# Patient Record
Sex: Female | Born: 1940 | Race: White | Hispanic: No | Smoking: Never smoker
Health system: Southern US, Community
[De-identification: ages and names within clinical notes are randomized; demographics above are authoritative.]

## PROBLEM LIST (undated history)

## (undated) DIAGNOSIS — R053 Chronic cough: Secondary | ICD-10-CM

## (undated) DIAGNOSIS — I10 Essential (primary) hypertension: Secondary | ICD-10-CM

## (undated) DIAGNOSIS — M1990 Unspecified osteoarthritis, unspecified site: Secondary | ICD-10-CM

## (undated) DIAGNOSIS — K219 Gastro-esophageal reflux disease without esophagitis: Secondary | ICD-10-CM

## (undated) DIAGNOSIS — E669 Obesity, unspecified: Secondary | ICD-10-CM

## (undated) DIAGNOSIS — Z9889 Other specified postprocedural states: Secondary | ICD-10-CM

## (undated) HISTORY — PX: APPENDECTOMY: SHX54

---

## 2015-11-25 ENCOUNTER — Ambulatory Visit
Admission: EM | Admit: 2015-11-25 | Discharge: 2015-11-25 | Disposition: A | Discharge status: Home / Self Care | Payer: PPO | Attending: Family Medicine | Admitting: Family Medicine

## 2015-11-25 ENCOUNTER — Ambulatory Visit (INDEPENDENT_AMBULATORY_CARE_PROVIDER_SITE_OTHER): Payer: PPO

## 2015-11-25 DIAGNOSIS — S40011A Contusion of right shoulder, initial encounter: Secondary | ICD-10-CM

## 2015-11-25 DIAGNOSIS — M7581 Other shoulder lesions, right shoulder: Secondary | ICD-10-CM | POA: Diagnosis not present

## 2015-11-25 DIAGNOSIS — S40021A Contusion of right upper arm, initial encounter: Secondary | ICD-10-CM

## 2015-11-25 DIAGNOSIS — M79601 Pain in right arm: Secondary | ICD-10-CM | POA: Diagnosis not present

## 2015-11-25 DIAGNOSIS — M25511 Pain in right shoulder: Secondary | ICD-10-CM | POA: Diagnosis not present

## 2015-11-25 DIAGNOSIS — IMO0001 Reserved for inherently not codable concepts without codable children: Secondary | ICD-10-CM

## 2015-11-25 NOTE — Discharge Instructions (Signed)

## 2015-11-25 NOTE — ED Provider Notes (Signed)
CSN: [id number]     Arrival date & time 11/25/15  0804 History   First MD Initiated Contact with Patient 11/25/15 [phone]     Chief Complaint  Patient presents with  . Shoulder Pain   (Consider location/radiation/quality/duration/timing/severity/associated sxs/prior Treatment) HPI   75 year old female who presents in injury to her right dominant shoulder occurred yesterday. He states that she was at her daughter's home when she tripped over a board tended to the head dated the dog from going outside. She states that she fell directly onto her right shoulder aching that her arm was slightly abducted. At that time the pain has been severe mostly with movement and she indicates subacromial radiation into her upper humeral area. She was able to sleep fairly comfortably last night by sleeping in a recliner. She denies any numbness or tingling distally.  History reviewed. No pertinent past medical history. Past Surgical History  Procedure Laterality Date  . Appendectomy     Family History  Problem Relation Age of Onset  . Heart attack Mother   . Heart failure Father    Social History  Substance Use Topics  . Smoking status: Never Smoker   . Smokeless tobacco: None  . Alcohol Use: No   OB History    No data available     Review of Systems  Constitutional: Positive for activity change. Negative for fever and chills.  Musculoskeletal: Positive for arthralgias.  All other systems reviewed and are negative.   Allergies  Shellfish allergy  Home Medications   Prior to Admission medications   Not on File   Meds Ordered and Administered this Visit  Medications - No data to display  BP 138/77 mmHg  Pulse 74  Temp(Src) 97.3 F (36.3 C) (Tympanic)  Resp 17  Ht  (1.626 m)  Wt 235 lb (106.595 kg)  BMI 40.32 kg/m2  SpO2 98% No data found.   Physical Exam  Constitutional: She is oriented to person, place, and time. She appears well-developed and well-nourished. No distress.   HENT:  Head: Normocephalic and atraumatic.  Eyes: Conjunctivae are normal. Pupils are equal, round, and reactive to light.  Neck: Normal range of motion. Neck supple.  Musculoskeletal: She exhibits tenderness.  Examination of the right shoulder shows fairly comfortable range of motion through a limited range with relaxation and done passively. Actively the patient does have very slight limited range of motion. Is mostly with external rotation and abduction. Distal sensation is intact to light touch throughout. She has a full range of motion and is comfortable with the elbow wrist and fingers. She has no clavicular tenderness. There is no tenderness about the acromion acromion with subacromial there is tenderness of the proximal humerus as well. Her neck is comfortable without any limitation of motion.  Neurological: She is alert and oriented to person, place, and time.  Skin: Skin is warm and dry. She is not diaphoretic.  Psychiatric: She has a normal mood and affect. Her behavior is normal. Judgment and thought content normal.  Nursing note and vitals reviewed.   ED Course  Procedures (including critical care time)  Labs Review Labs Reviewed - No data to display  Imaging Review Dg Shoulder Right  11/25/2015  CLINICAL DATA:  75 year old female with right proximal anterior humerus pain and very decreased range of motion after a fall yesterday. Initial encounter. EXAM: RIGHT SHOULDER - 2+ VIEW COMPARISON:  None. FINDINGS: No glenohumeral joint dislocation. No fracture identified in the proximal right humerus. There are  small ossific fragments along the inferior rim of the glenoid, but those appear to be degenerative and/or chronic. The right scapula otherwise appears intact. Right clavicle intact. Visible right ribs and lung parenchyma within normal limits. IMPRESSION: No acute fracture or dislocation identified about the right shoulder. If occult fracture is suspected right shoulder CT may be  most valuable. Electronically Signed   By: Odessa Fleming M.D.   On: 11/25/2015 08:59     Visual Acuity Review  Right Eye Distance:   Left Eye Distance:   Bilateral Distance:    Right Eye Near:   Left Eye Near:    Bilateral Near:         MDM   1. Contusion of right shoulder or upper extremity, initial encounter   2. Rotator cuff tendonitis, right    New Prescriptions   No medications on file  Plan: 1. Test/x-ray results and diagnosis reviewed with patient 2. rx as per orders; risks, benefits, potential side effects reviewed with patient 3. Recommend supportive treatment with a sling for comfort. Recommended she continue to sleep in a recliner for comfort as well. Instructed her in pendulum exercises try maintain shoulder range of motion. She will use anti-inflammatory medications for pain relief. She refused narcotic medications. I've also recommended that she make an appointment with orthopedics to follow her progress. No obvious fractures or dislocations on today's films. Did tell her that there is a possibility of an occult fracture but would most likely be treated the same. However because of her distinct risk of a frozen shoulder orthopedic follow-up would be and encouraged. 4. F/u with orthopedic surgeon   Lutricia Feil, PA-C 11/25/15 0945

## 2015-11-25 NOTE — ED Notes (Signed)
States tripped over a board yesterday morning landing on right shoulder. Unable to lift arm or move shoulder.

## 2015-12-07 DIAGNOSIS — M25511 Pain in right shoulder: Secondary | ICD-10-CM | POA: Diagnosis not present

## 2015-12-15 ENCOUNTER — Other Ambulatory Visit: Payer: Self-pay | Admitting: Orthopedic Surgery

## 2015-12-15 DIAGNOSIS — M25511 Pain in right shoulder: Secondary | ICD-10-CM

## 2016-01-05 ENCOUNTER — Ambulatory Visit
Admission: RE | Admit: 2016-01-05 | Discharge: 2016-01-05 | Disposition: A | Discharge status: Home / Self Care | Payer: PPO | Source: Ambulatory Visit | Attending: Orthopedic Surgery | Admitting: Orthopedic Surgery

## 2016-01-05 DIAGNOSIS — S46011A Strain of muscle(s) and tendon(s) of the rotator cuff of right shoulder, initial encounter: Secondary | ICD-10-CM | POA: Diagnosis not present

## 2016-01-05 DIAGNOSIS — M19011 Primary osteoarthritis, right shoulder: Secondary | ICD-10-CM | POA: Diagnosis not present

## 2016-01-05 DIAGNOSIS — X58XXXA Exposure to other specified factors, initial encounter: Secondary | ICD-10-CM | POA: Diagnosis not present

## 2016-01-05 DIAGNOSIS — M25411 Effusion, right shoulder: Secondary | ICD-10-CM | POA: Diagnosis not present

## 2016-01-05 DIAGNOSIS — M25511 Pain in right shoulder: Secondary | ICD-10-CM | POA: Diagnosis not present

## 2016-01-10 DIAGNOSIS — M75121 Complete rotator cuff tear or rupture of right shoulder, not specified as traumatic: Secondary | ICD-10-CM | POA: Diagnosis not present

## 2016-01-21 ENCOUNTER — Other Ambulatory Visit: Payer: Self-pay | Admitting: Orthopedic Surgery

## 2016-02-18 DIAGNOSIS — Z136 Encounter for screening for cardiovascular disorders: Secondary | ICD-10-CM | POA: Diagnosis not present

## 2016-02-18 DIAGNOSIS — Z01818 Encounter for other preprocedural examination: Secondary | ICD-10-CM | POA: Diagnosis not present

## 2016-02-18 DIAGNOSIS — Z1389 Encounter for screening for other disorder: Secondary | ICD-10-CM | POA: Diagnosis not present

## 2016-02-18 DIAGNOSIS — Z2821 Immunization not carried out because of patient refusal: Secondary | ICD-10-CM | POA: Diagnosis not present

## 2016-02-18 DIAGNOSIS — Z6841 Body Mass Index (BMI) 40.0 and over, adult: Secondary | ICD-10-CM | POA: Diagnosis not present

## 2016-03-06 ENCOUNTER — Encounter: Payer: Self-pay | Admitting: *Deleted

## 2016-03-06 ENCOUNTER — Other Ambulatory Visit: Payer: PPO

## 2016-03-06 NOTE — Patient Instructions (Signed)
  Your procedure is scheduled on: 03-09-16 (THURSDAY) Report to [facility] 2ND FLOOR To find out your arrival time please call [phone] between 1PM - 3PM on 03-08-16 [facility](WEDNESDAY)  Remember: Instructions that are not followed completely may result in serious medical risk, up to and including death, or upon the discretion of your surgeon and anesthesiologist your surgery may need to be rescheduled.    _X___ 1. Do not eat food or drink liquids after midnight. No gum chewing or hard candies.     _X___ 2. No Alcohol for 24 hours before or after surgery.   ____ 3. Bring all medications with you on the day of surgery if instructed.    _X___ 4. Notify your doctor if there is any change in your medical condition     (cold, fever, infections).     Do not wear jewelry, make-up, hairpins, clips or nail polish.  Do not wear lotions, powders, or perfumes. You may wear deodorant.  Do not shave 48 hours prior to surgery. Men may shave face and neck.  Do not bring valuables to the hospital.    [facility] is not responsible for any belongings or valuables.               Contacts, dentures or bridgework may not be worn into surgery.  Leave your suitcase in the car. After surgery it may be brought to your room.  For patients admitted to the hospital, discharge time is determined by your treatment team.   Patients discharged the day of surgery will not be allowed to drive home.   Please read over the following fact sheets that you were given:      ____ Take these medicines the morning of surgery with A SIP OF WATER:    1. NONE  2.   3.   4.  5.  6.  ____ Fleet Enema (as directed)   _X___ Use CHG Soap as directed  ____ Use inhalers on the day of surgery  ____ Stop metformin 2 days prior to surgery    ____ Take 1/2 of usual insulin dose the night before surgery and none on the morning of surgery.   ____ Stop Coumadin/Plavix/aspirin-N/A  _X___ Stop  Anti-inflammatories-STOP IBUPROFEN NOW-NO NSAIDS OR ASPIRIN PRODUCTS-TYLENOL OK TO TAKE   ____ Stop supplements until after surgery.    ____ Bring C-Pap to the hospital.

## 2016-03-07 ENCOUNTER — Encounter
Admission: RE | Admit: 2016-03-07 | Discharge: 2016-03-07 | Disposition: A | Discharge status: Home / Self Care | Payer: PPO | Source: Ambulatory Visit | Attending: Orthopedic Surgery | Admitting: Orthopedic Surgery

## 2016-03-07 DIAGNOSIS — M7541 Impingement syndrome of right shoulder: Secondary | ICD-10-CM | POA: Diagnosis not present

## 2016-03-07 DIAGNOSIS — M75101 Unspecified rotator cuff tear or rupture of right shoulder, not specified as traumatic: Secondary | ICD-10-CM | POA: Diagnosis not present

## 2016-03-07 DIAGNOSIS — Z6841 Body Mass Index (BMI) 40.0 and over, adult: Secondary | ICD-10-CM | POA: Diagnosis not present

## 2016-03-07 DIAGNOSIS — M19011 Primary osteoarthritis, right shoulder: Secondary | ICD-10-CM | POA: Diagnosis not present

## 2016-03-07 DIAGNOSIS — Z91013 Allergy to seafood: Secondary | ICD-10-CM | POA: Diagnosis not present

## 2016-03-07 LAB — PROTIME-INR
INR: 1.01
Prothrombin Time: 13.5 seconds (ref 11.4–15.0)

## 2016-03-07 LAB — APTT: APTT: 29 s (ref 24–36)

## 2016-03-09 ENCOUNTER — Observation Stay
Admission: RE | Admit: 2016-03-09 | Discharge: 2016-03-10 | Disposition: A | Discharge status: Home / Self Care | Payer: PPO | Source: Ambulatory Visit | Attending: Orthopedic Surgery | Admitting: Orthopedic Surgery

## 2016-03-09 ENCOUNTER — Ambulatory Visit: Payer: PPO | Admitting: Anesthesiology

## 2016-03-09 ENCOUNTER — Encounter
Admission: RE | Disposition: A | Discharge status: Home / Self Care | Payer: Self-pay | Source: Ambulatory Visit | Attending: Orthopedic Surgery

## 2016-03-09 DIAGNOSIS — M7541 Impingement syndrome of right shoulder: Secondary | ICD-10-CM | POA: Diagnosis not present

## 2016-03-09 DIAGNOSIS — Z5333 Arthroscopic surgical procedure converted to open procedure: Secondary | ICD-10-CM | POA: Diagnosis not present

## 2016-03-09 DIAGNOSIS — M75120 Complete rotator cuff tear or rupture of unspecified shoulder, not specified as traumatic: Secondary | ICD-10-CM | POA: Diagnosis present

## 2016-03-09 DIAGNOSIS — M19011 Primary osteoarthritis, right shoulder: Secondary | ICD-10-CM | POA: Insufficient documentation

## 2016-03-09 DIAGNOSIS — G8918 Other acute postprocedural pain: Secondary | ICD-10-CM | POA: Diagnosis not present

## 2016-03-09 DIAGNOSIS — Z6841 Body Mass Index (BMI) 40.0 and over, adult: Secondary | ICD-10-CM | POA: Insufficient documentation

## 2016-03-09 DIAGNOSIS — M75101 Unspecified rotator cuff tear or rupture of right shoulder, not specified as traumatic: Secondary | ICD-10-CM | POA: Diagnosis not present

## 2016-03-09 DIAGNOSIS — Z91013 Allergy to seafood: Secondary | ICD-10-CM | POA: Insufficient documentation

## 2016-03-09 DIAGNOSIS — M75121 Complete rotator cuff tear or rupture of right shoulder, not specified as traumatic: Secondary | ICD-10-CM | POA: Diagnosis not present

## 2016-03-09 DIAGNOSIS — S43421A Sprain of right rotator cuff capsule, initial encounter: Secondary | ICD-10-CM | POA: Diagnosis not present

## 2016-03-09 DIAGNOSIS — M25511 Pain in right shoulder: Secondary | ICD-10-CM | POA: Diagnosis not present

## 2016-03-09 HISTORY — PX: SHOULDER ARTHROSCOPY WITH OPEN ROTATOR CUFF REPAIR AND DISTAL CLAVICLE ACROMINECTOMY: SHX5683

## 2016-03-09 SURGERY — SHOULDER ARTHROSCOPY WITH OPEN ROTATOR CUFF REPAIR AND DISTAL CLAVICLE ACROMINECTOMY
Anesthesia: General | Laterality: Right | Wound class: Clean

## 2016-03-09 MED ORDER — IBUPROFEN 400 MG PO TABS
200.0000 mg | ORAL_TABLET | ORAL | Status: DC | PRN
  Administered 2016-03-09 – 2016-03-10 (×2): 200 mg via ORAL
  Filled 2016-03-09 (×2): qty 1

## 2016-03-09 MED ORDER — FAMOTIDINE 20 MG PO TABS: 20.0000 mg | ORAL_TABLET | Freq: Once | ORAL | Status: DC

## 2016-03-09 MED ORDER — BUPIVACAINE HCL (PF) 0.25 % IJ SOLN
INTRAMUSCULAR | Status: AC
  Filled 2016-03-09: qty 30

## 2016-03-09 MED ORDER — EPINEPHRINE HCL 1 MG/ML IJ SOLN
INTRAMUSCULAR | Status: AC
  Filled 2016-03-09: qty 1

## 2016-03-09 MED ORDER — ROPIVACAINE HCL 5 MG/ML IJ SOLN
INTRAMUSCULAR | Status: DC | PRN
  Administered 2016-03-09 (×3): 10 mL via PERINEURAL

## 2016-03-09 MED ORDER — CEFAZOLIN SODIUM-DEXTROSE 2-4 GM/100ML-% IV SOLN
2.0000 g | INTRAVENOUS | Status: AC
  Administered 2016-03-09: 2 g via INTRAVENOUS

## 2016-03-09 MED ORDER — ONDANSETRON HCL 4 MG/2ML IJ SOLN
INTRAMUSCULAR | Status: DC | PRN
  Administered 2016-03-09: 4 mg via INTRAVENOUS

## 2016-03-09 MED ORDER — OXYCODONE HCL 5 MG PO TABS: 5.0000 mg | ORAL_TABLET | ORAL | Status: DC | PRN

## 2016-03-09 MED ORDER — NEOMYCIN-POLYMYXIN B GU 40-200000 IR SOLN
Status: AC
  Filled 2016-03-09: qty 20

## 2016-03-09 MED ORDER — NEOSTIGMINE METHYLSULFATE 10 MG/10ML IV SOLN
INTRAVENOUS | Status: DC | PRN
  Administered 2016-03-09: 4 mg via INTRAVENOUS

## 2016-03-09 MED ORDER — ONDANSETRON HCL 4 MG/2ML IJ SOLN
INTRAMUSCULAR | Status: AC
  Filled 2016-03-09: qty 2

## 2016-03-09 MED ORDER — LIDOCAINE HCL (CARDIAC) 20 MG/ML IV SOLN
INTRAVENOUS | Status: DC | PRN
  Administered 2016-03-09: 10 mg via INTRAVENOUS
  Administered 2016-03-09: 40 mg via INTRAVENOUS

## 2016-03-09 MED ORDER — PROMETHAZINE HCL 12.5 MG RE SUPP
12.5000 mg | Freq: Once | RECTAL | Status: AC
  Administered 2016-03-09: 12.5 mg via RECTAL
  Filled 2016-03-09: qty 1

## 2016-03-09 MED ORDER — FENTANYL CITRATE (PF) 100 MCG/2ML IJ SOLN: 25.0000 ug | INTRAMUSCULAR | Status: DC | PRN

## 2016-03-09 MED ORDER — IPRATROPIUM-ALBUTEROL 0.5-2.5 (3) MG/3ML IN SOLN
RESPIRATORY_TRACT | Status: AC
  Administered 2016-03-09: 3 mL
  Filled 2016-03-09: qty 3

## 2016-03-09 MED ORDER — OXYCODONE HCL 5 MG/5ML PO SOLN: 5.0000 mg | Freq: Once | ORAL | Status: DC | PRN

## 2016-03-09 MED ORDER — ONDANSETRON HCL 4 MG/2ML IJ SOLN
4.0000 mg | Freq: Four times a day (QID) | INTRAMUSCULAR | Status: DC
  Administered 2016-03-09 – 2016-03-10 (×2): 4 mg via INTRAVENOUS
  Filled 2016-03-09 (×3): qty 2

## 2016-03-09 MED ORDER — OXYCODONE HCL 5 MG PO TABS: 5.0000 mg | ORAL_TABLET | Freq: Once | ORAL | Status: DC | PRN

## 2016-03-09 MED ORDER — ACETAMINOPHEN 500 MG PO TABS
500.0000 mg | ORAL_TABLET | ORAL | Status: DC | PRN
  Administered 2016-03-10: 500 mg via ORAL
  Filled 2016-03-09: qty 1

## 2016-03-09 MED ORDER — GLYCOPYRROLATE 0.2 MG/ML IJ SOLN
INTRAMUSCULAR | Status: DC | PRN
  Administered 2016-03-09: 0.6 mg via INTRAVENOUS

## 2016-03-09 MED ORDER — FAMOTIDINE 20 MG PO TABS
ORAL_TABLET | ORAL | Status: AC
  Administered 2016-03-09: 20 mg
  Filled 2016-03-09: qty 1

## 2016-03-09 MED ORDER — OXYCODONE HCL 5 MG PO TABS
5.0000 mg | ORAL_TABLET | ORAL | Status: DC | PRN
  Administered 2016-03-09 – 2016-03-10 (×5): 5 mg via ORAL
  Filled 2016-03-09 (×5): qty 1

## 2016-03-09 MED ORDER — LIDOCAINE HCL (PF) 1 % IJ SOLN
INTRAMUSCULAR | Status: AC
  Filled 2016-03-09: qty 30

## 2016-03-09 MED ORDER — CEFAZOLIN SODIUM-DEXTROSE 2-4 GM/100ML-% IV SOLN
INTRAVENOUS | Status: AC
  Filled 2016-03-09: qty 100

## 2016-03-09 MED ORDER — PHENYLEPHRINE HCL 10 MG/ML IJ SOLN
INTRAMUSCULAR | Status: DC | PRN
  Administered 2016-03-09 (×2): 100 ug via INTRAVENOUS
  Administered 2016-03-09: 200 ug via INTRAVENOUS
  Administered 2016-03-09 (×4): 100 ug via INTRAVENOUS

## 2016-03-09 MED ORDER — CHLORHEXIDINE GLUCONATE 4 % EX LIQD
1.0000 "application " | Freq: Once | CUTANEOUS | Status: AC
  Administered 2016-03-08: 1 via TOPICAL

## 2016-03-09 MED ORDER — ONDANSETRON HCL 4 MG/2ML IJ SOLN
4.0000 mg | Freq: Once | INTRAMUSCULAR | Status: AC
  Administered 2016-03-09: 4 mg via INTRAVENOUS

## 2016-03-09 MED ORDER — PROPOFOL 10 MG/ML IV BOLUS
INTRAVENOUS | Status: DC | PRN
  Administered 2016-03-09: 140 mg via INTRAVENOUS

## 2016-03-09 MED ORDER — IPRATROPIUM-ALBUTEROL 0.5-2.5 (3) MG/3ML IN SOLN: 3.0000 mL | Freq: Once | RESPIRATORY_TRACT | Status: DC

## 2016-03-09 MED ORDER — CHLORHEXIDINE GLUCONATE 4 % EX LIQD
1.0000 "application " | Freq: Once | CUTANEOUS | Status: AC
  Administered 2016-03-09: 1 via TOPICAL

## 2016-03-09 MED ORDER — LACTATED RINGERS IV SOLN
INTRAVENOUS | Status: DC
  Administered 2016-03-09: 06:00:00 via INTRAVENOUS

## 2016-03-09 MED ORDER — MIDAZOLAM HCL 2 MG/2ML IJ SOLN
INTRAMUSCULAR | Status: DC | PRN
  Administered 2016-03-09: 2 mg via INTRAVENOUS

## 2016-03-09 MED ORDER — FENTANYL CITRATE (PF) 100 MCG/2ML IJ SOLN
INTRAMUSCULAR | Status: DC | PRN
  Administered 2016-03-09 (×3): 25 ug via INTRAVENOUS
  Administered 2016-03-09 (×3): 50 ug via INTRAVENOUS

## 2016-03-09 MED ORDER — DEXAMETHASONE SODIUM PHOSPHATE 10 MG/ML IJ SOLN
INTRAMUSCULAR | Status: DC | PRN
  Administered 2016-03-09: 4 mg via INTRAVENOUS

## 2016-03-09 MED ORDER — ROPIVACAINE HCL 5 MG/ML IJ SOLN
INTRAMUSCULAR | Status: AC
  Filled 2016-03-09: qty 40

## 2016-03-09 MED ORDER — LIDOCAINE HCL (PF) 1 % IJ SOLN
INTRAMUSCULAR | Status: DC | PRN
  Administered 2016-03-09: 10 mL

## 2016-03-09 MED ORDER — ONDANSETRON HCL 4 MG PO TABS: 4.0000 mg | ORAL_TABLET | Freq: Three times a day (TID) | ORAL | Status: DC | PRN

## 2016-03-09 MED ORDER — ROCURONIUM BROMIDE 100 MG/10ML IV SOLN
INTRAVENOUS | Status: DC | PRN
  Administered 2016-03-09 (×2): 5 mg via INTRAVENOUS
  Administered 2016-03-09: 40 mg via INTRAVENOUS
  Administered 2016-03-09: 5 mg via INTRAVENOUS
  Administered 2016-03-09: 10 mg via INTRAVENOUS
  Administered 2016-03-09: 5 mg via INTRAVENOUS

## 2016-03-09 MED ORDER — MORPHINE SULFATE (PF) 2 MG/ML IV SOLN: 2.0000 mg | INTRAVENOUS | Status: DC | PRN

## 2016-03-09 SURGICAL SUPPLY — 66 items
ADAPTER IRRIG TUBE 2 SPIKE SOL (ADAPTER) ×6 IMPLANT
ANCHOR ALL-SUT Q-FIX 2.8 (Anchor) ×3 IMPLANT
ANCHOR SUT PEEK 3.0 ST 3 (SUTURE) ×2 IMPLANT
ANCHOR SUT PEEK 3.0MM ST 3MM (SUTURE) ×1
BUR RADIUS 4.0X18.5 (BURR) ×3 IMPLANT
BUR RADIUS 5.5 (BURR) ×3 IMPLANT
CANNULA 5.75X7 CRYSTAL CLEAR (CANNULA) ×6 IMPLANT
CANNULA PARTIAL THREAD 2X7 (CANNULA) ×3 IMPLANT
CANNULA TWIST IN 8.25X9CM (CANNULA) ×6 IMPLANT
CLOSURE WOUND 1/2 X4 (GAUZE/BANDAGES/DRESSINGS) ×1
CONNECTOR PERFECT PASSER (CONNECTOR) ×3 IMPLANT
COOLER POLAR GLACIER W/PUMP (MISCELLANEOUS) ×3 IMPLANT
CRADLE LAMINECT ARM (MISCELLANEOUS) ×6 IMPLANT
DRAPE IMP U-DRAPE 54X76 (DRAPES) ×6 IMPLANT
DRAPE INCISE IOBAN 66X45 STRL (DRAPES) ×3 IMPLANT
DRAPE SHEET LG 3/4 BI-LAMINATE (DRAPES) ×3 IMPLANT
DRAPE U-SHAPE 47X51 STRL (DRAPES) ×6 IMPLANT
DURAPREP 26ML APPLICATOR (WOUND CARE) ×9 IMPLANT
ELECT REM PT RETURN 9FT ADLT (ELECTROSURGICAL) ×3
ELECTRODE REM PT RTRN 9FT ADLT (ELECTROSURGICAL) ×1 IMPLANT
GAUZE PETRO XEROFOAM 1X8 (MISCELLANEOUS) ×3 IMPLANT
GAUZE SPONGE 4X4 12PLY STRL (GAUZE/BANDAGES/DRESSINGS) ×3 IMPLANT
GLOVE BIOGEL PI IND STRL 9 (GLOVE) ×1 IMPLANT
GLOVE BIOGEL PI INDICATOR 9 (GLOVE) ×2
GLOVE SURG 9.0 ORTHO LTXF (GLOVE) ×12 IMPLANT
GOWN STRL REUS TWL 2XL XL LVL4 (GOWN DISPOSABLE) ×6 IMPLANT
GOWN STRL REUS W/ TWL LRG LVL3 (GOWN DISPOSABLE) ×2 IMPLANT
GOWN STRL REUS W/TWL LRG LVL3 (GOWN DISPOSABLE) ×4
IV LACTATED RINGER IRRG 3000ML (IV SOLUTION) ×28
IV LR IRRIG 3000ML ARTHROMATIC (IV SOLUTION) ×14 IMPLANT
KIT RM TURNOVER STRD PROC AR (KITS) ×3 IMPLANT
KIT STABILIZATION SHOULDER (MISCELLANEOUS) ×3 IMPLANT
KIT SUTURE 2.8 Q-FIX DISP (MISCELLANEOUS) ×3 IMPLANT
MANIFOLD NEPTUNE II (INSTRUMENTS) ×3 IMPLANT
MASK FACE SPIDER DISP (MASK) ×3 IMPLANT
MAT BLUE FLOOR 46X72 FLO (MISCELLANEOUS) ×6 IMPLANT
NDL SAFETY 18GX1.5 (NEEDLE) ×3 IMPLANT
NDL SAFETY 22GX1.5 (NEEDLE) ×3 IMPLANT
NS IRRIG 500ML POUR BTL (IV SOLUTION) ×6 IMPLANT
PACK ARTHROSCOPY SHOULDER (MISCELLANEOUS) ×3 IMPLANT
PAD WRAPON POLAR SHDR UNIV (MISCELLANEOUS) ×1 IMPLANT
PASSER SUT CAPTURE FIRST (SUTURE) ×3 IMPLANT
SET TUBE SUCT SHAVER OUTFL 24K (TUBING) ×3 IMPLANT
SET TUBE TIP INTRA-ARTICULAR (MISCELLANEOUS) ×3 IMPLANT
SPONGE LAP 18X18 5 PK (GAUZE/BANDAGES/DRESSINGS) ×3 IMPLANT
STRIP CLOSURE SKIN 1/2X4 (GAUZE/BANDAGES/DRESSINGS) ×2 IMPLANT
SUT ETHILON 4-0 (SUTURE) ×2
SUT ETHILON 4-0 FS2 18XMFL BLK (SUTURE) ×1
SUT KNTLS 2.8 MAGNUM (Anchor) ×6 IMPLANT
SUT MNCRL 4-0 (SUTURE) ×2
SUT MNCRL 4-0 27XMFL (SUTURE) ×1
SUT PDS AB 0 CT1 27 (SUTURE) ×6 IMPLANT
SUT PERFECTPASSER WHITE CART (SUTURE) ×12 IMPLANT
SUT SMART STITCH CARTRIDGE (SUTURE) ×6 IMPLANT
SUT VIC AB 0 CT1 36 (SUTURE) ×9 IMPLANT
SUT VIC AB 2-0 CT2 27 (SUTURE) ×3 IMPLANT
SUTURE ETHLN 4-0 FS2 18XMF BLK (SUTURE) ×1 IMPLANT
SUTURE MAGNUM WIRE 2X48 BLK (SUTURE) ×6 IMPLANT
SUTURE MNCRL 4-0 27XMF (SUTURE) ×1 IMPLANT
SYRINGE 10CC LL (SYRINGE) ×3 IMPLANT
TAPE MICROFOAM 4IN (TAPE) ×3 IMPLANT
TUBING ARTHRO INFLOW-ONLY STRL (TUBING) ×3 IMPLANT
TUBING CONNECTING 10 (TUBING) ×2 IMPLANT
TUBING CONNECTING 10' (TUBING) ×1
WAND HAND CNTRL MULTIVAC 90 (MISCELLANEOUS) ×3 IMPLANT
WRAPON POLAR PAD SHDR UNIV (MISCELLANEOUS) ×3

## 2016-03-09 NOTE — Anesthesia Postprocedure Evaluation (Signed)
Anesthesia Post Note  Patient: Veronica Olsen  Procedure(s) Performed: Procedure(s) (LRB): SHOULDER ARTHROSCOPY WITH OPEN ROTATOR CUFF REPAIR AND DISTAL CLAVICLE ACROMINECTOMY (Right)  Patient location during evaluation: PACU Anesthesia Type: General Level of consciousness: awake and alert Pain management: pain level controlled Vital Signs Assessment: post-procedure vital signs reviewed and stable Respiratory status: spontaneous breathing, nonlabored ventilation, respiratory function stable and patient connected to nasal cannula oxygen Cardiovascular status: blood pressure returned to baseline and stable Postop Assessment: no signs of nausea or vomiting Anesthetic complications: no    Last Vitals:  Filed Vitals:   03/09/16 1219 03/09/16 1239  BP: 123/67 115/64  Pulse: 89 80  Temp:    Resp: 18 18    Last Pain: There were no vitals filed for this visit.               Cleda MccreedyJoseph K Mertis Mosher

## 2016-03-09 NOTE — Progress Notes (Signed)
  Subjective:  POST OP CHECK: Patient admitted postop due to nausea and feeling unsteady. Patient is showing signs of a peroneal nerve sensory palsy in the left lower extremity.  Her pain is currently controlled.  Objective:   VITALS:   Filed Vitals:   03/09/16 1219 03/09/16 1239 03/09/16 1402 03/09/16 1558  BP: 123/67 115/64 133/68 129/70  Pulse: 89 80  79  Temp:    98 F (36.7 C)  TempSrc:    Oral  Resp: 18 18  18   Height:      Weight:      SpO2: 91% 91%  93%    PHYSICAL EXAM:  Right shoulder/upper extremity:  Dressing is clean dry and intact. Sensation is returning to her fingers status post block. She has palpable pedal pulses and her fingers are well-perfused.  Left lower extremity: Patient has decreased sensation to light touch. She explains that she has paresthesias in the lower aspect of the left leg foot and toes. Patient can dorsiflex and plantarflex her ankle and flex and extend her toes.   LABS  No results found for this or any previous visit (from the past 24 hour(s)).  No results found.  Assessment/Plan: Day of Surgery   Active Problems:   Full thickness rotator cuff tear  Patient stable postop. Her nausea is improved. I expect that her sensory peroneal palsy will improve with time. She has intact motor function will not need an AFO brace. She will be evaluated by physical and occupational therapy tomorrow. She will continue oxycodone and morphine overnight for pain control as needed. Patient's TENS unit insert. She will continue Polar Care overnight. I will reevaluate tomorrow.    Juanell FairlyKRASINSKI, Rollo Farquhar , MD 03/09/2016, 6:26 PM

## 2016-03-09 NOTE — Progress Notes (Signed)
Assisted to standing and was unable to take a step due to being weak. Dr. Martha ClanKrasinski came to evaluate the patient and decided to admit her overnight due to weakness and continued nausea.

## 2016-03-09 NOTE — Anesthesia Procedure Notes (Addendum)
Anesthesia Regional Block:  Interscalene brachial plexus block  Pre-Anesthetic Checklist: ,, timeout performed, Correct Patient, Correct Site, Correct Laterality, Correct Procedure, Correct Position, site marked, Risks and benefits discussed,  Surgical consent,  Pre-op evaluation,  At surgeon's request and post-op pain management  Laterality: Right  Prep: alcohol swabs       Needles:  Injection technique: Single-shot  Needle Type: Stimiplex     Needle Length: 5cm 5 cm Needle Gauge: 22 and 22 G    Additional Needles:  Procedures: ultrasound guided (picture in chart) and nerve stimulator Interscalene brachial plexus block  Nerve Stimulator or Paresthesia:  Response: biceps flexion,   Additional Responses:   Narrative:  Start time: 03/09/2016 7:49 AM End time: 03/09/2016 7:51 AM Injection made incrementally with aspirations every 5 mL.  Performed by: Personally   Additional Notes: Functioning IV was confirmed and monitors were applied.  A 50mm 22ga Stimuplex needle was used. Sterile prep and drape,hand hygiene and sterile gloves were used.  Negative aspiration and negative test dose prior to incremental administration of local anesthetic. The patient tolerated the procedure well.      Procedure Name: Intubation Performed by: Casey BurkittHOANG, Dnasia Gauna Pre-anesthesia Checklist: Emergency Drugs available, Patient identified, Suction available, Patient being monitored and Timeout performed Patient Re-evaluated:Patient Re-evaluated prior to inductionOxygen Delivery Method: Circle system utilized Preoxygenation: Pre-oxygenation with 100% oxygen Intubation Type: IV induction Ventilation: Mask ventilation without difficulty Laryngoscope Size: Mac and 3 Grade View: Grade I Tube type: Oral Tube size: 7.0 mm Number of attempts: 1 Airway Equipment and Method: Stylet Placement Confirmation: positive ETCO2,  breath sounds checked- equal and bilateral and ETT inserted through vocal cords under  direct vision Secured at: 23 cm Tube secured with: Tape Dental Injury: Teeth and Oropharynx as per pre-operative assessment

## 2016-03-09 NOTE — Transfer of Care (Signed)
Immediate Anesthesia Transfer of Care Note  Patient: Hilarie FredricksonMarjorie M Kalina  Procedure(s) Performed: Procedure(s): SHOULDER ARTHROSCOPY WITH OPEN ROTATOR CUFF REPAIR AND DISTAL CLAVICLE ACROMINECTOMY (Right)  Patient Location: PACU  Anesthesia Type:General  Level of Consciousness: responds to stimulation, sleeping  Airway & Oxygen Therapy: Patient Spontanous Breathing and Patient connected to face mask oxygen  Post-op Assessment: Report given to RN and Post -op Vital signs reviewed and stable  Post vital signs: Reviewed and stable  Last Vitals:  Filed Vitals:   03/09/16 0609 03/09/16 1048  BP: 152/81 140/77  Pulse: 80   Temp: 36.7 C   Resp: 17 17    Last Pain: There were no vitals filed for this visit.    Patients Stated Pain Goal: 3 (03/09/16 16100609)  Complications: No apparent anesthesia complications

## 2016-03-09 NOTE — Progress Notes (Signed)
Assisted up to the bedside commode to urinate; had been incontinent of urine while sitting in the chair. Given phenergan suppository as per order. Gown changed, attends applied and assisted back to the chair.

## 2016-03-09 NOTE — H&P (Signed)
The patient has been re-examined, and the chart reviewed, and there have been no interval changes to the documented history and physical.    The risks, benefits, and alternatives have been discussed at length, and the patient is willing to proceed.   

## 2016-03-09 NOTE — Progress Notes (Signed)
Report given to Danne HarborAubrey, RN (ortho dept.). Patient taken to room 145, assisted to the bed and made comfortable. Family at bedside.

## 2016-03-09 NOTE — Anesthesia Preprocedure Evaluation (Addendum)
Anesthesia Evaluation  Patient identified by MRN, date of birth, ID band Patient awake    Reviewed: Allergy & Precautions, H&P , NPO status , Patient's Chart, lab work & pertinent test results  History of Anesthesia Complications Negative for: history of anesthetic complications  Airway Mallampati: III  TM Distance: <3 FB Neck ROM: limited    Dental  (+) Poor Dentition   Pulmonary neg pulmonary ROS, neg shortness of breath,    Pulmonary exam normal breath sounds clear to auscultation       Cardiovascular Exercise Tolerance: Good (-) angina(-) Past MI and (-) DOE negative cardio ROS Normal cardiovascular exam Rhythm:regular Rate:Normal     Neuro/Psych negative neurological ROS  negative psych ROS   GI/Hepatic negative GI ROS, Neg liver ROS, neg GERD  ,  Endo/Other  Morbid obesity  Renal/GU negative Renal ROS  negative genitourinary   Musculoskeletal   Abdominal   Peds  Hematology negative hematology ROS (+)   Anesthesia Other Findings History reviewed. No pertinent past medical history.  Past Surgical History:   APPENDECTOMY                                                    Comment:AGE 75  BMI    Body Mass Index   41.63 kg/m 2    Signs and symptoms suggestive of sleep apnea    Reproductive/Obstetrics negative OB ROS                             Anesthesia Physical Anesthesia Plan  ASA: III  Anesthesia Plan: General and General ETT   Post-op Pain Management:  Regional for Post-op pain   Induction:   Airway Management Planned:   Additional Equipment:   Intra-op Plan:   Post-operative Plan:   Informed Consent: I have reviewed the patients History and Physical, chart, labs and discussed the procedure including the risks, benefits and alternatives for the proposed anesthesia with the patient or authorized representative who has indicated his/her understanding and  acceptance.   Dental Advisory Given  Plan Discussed with: Anesthesiologist, CRNA and Surgeon  Anesthesia Plan Comments:        Anesthesia Quick Evaluation

## 2016-03-09 NOTE — Progress Notes (Signed)
C/o numbness in the left foot and lower leg, unable to raise left leg very high; is able to flex ankle back and forth but says it feels numb. Dr. Noralyn Pickarroll (anesthesia) notified and came to evaluate the patient. Possible position during surgery related. Recommends to inform Dr. Martha ClanKrasinski. Dr. Martha ClanKrasinski notified and stated that it is a common occurrence and will evaluate it when the patient sees him in his office if it is still a problem.

## 2016-03-09 NOTE — Op Note (Signed)
03/09/2016  10:46 AM  PATIENT:  Veronica Olsen  75 y.o. female  PRE-OPERATIVE DIAGNOSIS:  Right shoulder full thickness rotator cuff tear, acromioclavicular joint arthrosis, subacromial impingement  POST-OPERATIVE DIAGNOSIS:  Same   PROCEDURE:  Procedure(s): RIGHT SHOULDER ARTHROSCOPY WITH MINI-OPEN ROTATOR CUFF REPAIR, SUBACROMIAL DECOMPRESSION AND DISTAL CLAVICLE ACROMINECTOMY   SURGEON:  Surgeon(s) and Role:    * Thornton Park, MD - Primary  ANESTHESIA:   local, regional and general   PREOPERATIVE INDICATIONS:  Veronica Olsen is a  75 y.o. female with a diagnosis right shoulder full thickness rotator cuff tear with retraction who failed conservative treatment and had significant weakness and limitation of motion of the right shoulder and elected for surgical management.  Her symptoms are affecting her ability to perform activities of daily living. Rotator cuff tear was confirmed by MRI.  The risks benefits and alternatives were discussed with the patient preoperatively including but not limited to the risks of infection, bleeding, nerve injury, persistent pain or weakness, failure of the hardware, re-tear of the rotator cuff and the need for further surgery. Medical risks include DVT and pulmonary embolism, myocardial infarction, stroke, pneumonia, respiratory failure and death. Patient understood these risks and wished to proceed.  OPERATIVE IMPLANTS: ArthroCare Magnum 2 anchors x 2 & [employer] Q Fix anchors x 1  OPERATIVE FINDINGS: Full-thickness rotator cuff tear involving the supraspinatus with retraction   OPERATIVE PROCEDURE: The patient was met in the preoperative area. The right shoulder was signed with the word yes and my initials according the hospital's correct site of surgery protocol.  History and physical was updated. Patient was brought to the operating room where she underwent placement of a interscalene block and general endotracheal intubation. The  patient was placed in a beachchair position.  A spider arm positioner was used for this case. Examination under anesthesia revealed no limitation of motion or instability with load shift testing. The patient had a negative sulcus sign.  Patient was prepped and draped in a sterile fashion. A timeout was performed to verify the patient's name, date of birth, medical record number, correct site of surgery and correct procedure to be performed there was also used to verify the patient received antibiotics that all appropriate instruments, implants and radiographs studies were available in the room. Once all in attendance were in agreement case began.  Bony landmarks were drawn out with a surgical marker along with proposed arthroscopy incisions. These were pre-injected with 1% lidocaine plain. An 11 blade was used to establish a posterior portal through which the arthroscope was placed in the glenohumeral joint. A full diagnostic examination of the shoulder was performed.  Patient had fraying of the superior and anterior labrum. Subscapularis was intact as was the biceps tendon. Biceps tendon had mild fraying. There is no evidence of a SLAP tear. There were no loose bodies encountered. Patient was found to have a full tear involving the right supraspinatus tendon with retraction to the superior aspect of the humeral head.. An 18-gauge spinal needle was used to place a 0 PDS suture through the rotator cuff tear for identification from the bursal side.  The arthroscope was then placed in the subacromial space. The 0 PDS suture was identified. The partial-thickness tear was then completed with a 40 resector shaver blade.  Three Perfect Pass suture were placed in the lateral border of the rotator cuff tear. All arthroscopic instruments were then removed and the mini-open portion of the procedure began.  Extensive bursitis  was encountered and debrided using a 4.0 resector shaver blade and a 90 ArthroCare wand from a  lateral portal which was established under direct visualization using an 18-gauge spinal needle. A subacromial decompression was also performed using a 5.5 mm resector shaver blade from the lateral portal. A 5.5 mm resector shaver blade was then used to debride the greater tuberosity of all torn fibers of the rotator cuff.  All arthroscopic instruments were then removed.  A saber-type incision was made along the lateral border of the acromion. The deltoid muscle was identified and split in line with its fibers which allowed visualization of the rotator cuff. The Perfect Pass sutures previously placed in the lateral border of the rotator cuff were brought out through the deltoid split.  One Tamala Julian and [employer] anchor was placed at the articular margin of the humeral head with the greater tuberosity. The suture limbs of the Q Fix anchor were passed medially through the rotator cuff using a First Pass suture passer.   2. The perfect Pass sutures were used for the repair. The third one was removed.  The two Perfect Pass sutures were then anchored to the greater tuberosity footprint using two Magnum 2 anchors. These anchors were tensioned to allow for anatomic reduction of the rotator cuff to the greater tuberosity. The medial row sutures were then tied down using an arthroscopic knot tying technique.  Arthroscopic images of the repair were taken with the arthroscope both externally and from inside the glenohumeral joint.  All incisions were copiously irrigated. The deltoid fascia was repaired using a 0 Vicryl suture.  The subcutaneous tissue of all incisions were closed with a 2-0 Vicryl. Skin closure for the arthroscopic incisions was performed with 4-0 nylon. The skin edges of the saber incision was approximated with a running 4-0 undyed Monocryl.  0.25% Marcaine plain was then injected into the subacromial space for postoperative pain control. A dry sterile dressing was applied.  The patient was placed in an  abduction sling, with a Polar Care sleeve, a TENS unit.  All sharp and it instrument counts were correct at the conclusion of the case. I was scrubbed and present for the entire case. I spoke with the patient's family postoperatively to let them know the case had gone without complication and the patient was stable in recovery room.

## 2016-03-10 DIAGNOSIS — M75101 Unspecified rotator cuff tear or rupture of right shoulder, not specified as traumatic: Secondary | ICD-10-CM | POA: Diagnosis not present

## 2016-03-10 DIAGNOSIS — M25511 Pain in right shoulder: Secondary | ICD-10-CM | POA: Diagnosis not present

## 2016-03-10 NOTE — Discharge Instructions (Signed)
AMBULATORY SURGERY  DISCHARGE INSTRUCTIONS   1) The drugs that you were given will stay in your system until tomorrow so for the next 24 hours you should not:  A) Drive an automobile B) Make any legal decisions C) Drink any alcoholic beverage   2) You may resume regular meals tomorrow.  Today it is better to start with liquids and gradually work up to solid foods.  You may eat anything you prefer, but it is better to start with liquids, then soup and crackers, and gradually work up to solid foods.   3) Please notify your doctor immediately if you have any unusual bleeding, trouble breathing, redness and pain at the surgery site, drainage, fever, or pain not relieved by medication.    Please contact your physician with any problems or Same Day Surgery at [phone], Monday through Friday 6 am to 4 pm, or Start at [facility] Main number at [phone].   ORTHOPAEDIC INSTRUCTIONS:  Wear sling at all times, including sleep.  You will need to use the sling for a total of 4 weeks following surgery.  Do not try and lift your arm away from your body for any reason.   Keep the dressing dry.  You may remove bandage in 3 days.  Leave the Steri-Strips (white medical tape) in place.  You may place additional Band-Aids over top of the Steri-Strips if you wish.  May shower once dressing is removed in 3 days.  Remove sling carefully only for showers, leaving arm down by your side while in the shower.  Make sure to take some pain medication this evening before you fall asleep, in preparation for the nerve block wearing off in the middle of the night.  If the the pain medication causes itching, or is too strong, try taking a single tablet at a time, or combining with Benadryl.  You may be most comfortable sleeping in a recliner.  If you do sleep in near bed, placed pillows behind the shoulder that have the operation to support it.

## 2016-03-10 NOTE — Progress Notes (Signed)
  Subjective:  POD #1  status post right shoulder mini open rotator cuff repair. Patient reports pain as mild.  Patient feels much better today. Numbness in her left leg has dramatically improved. She only has the numbness in her ankle. Patient has her family at the bedside. She is tolerating by mouth diet and has no significant pain to the right shoulder.  Objective:   VITALS:   Filed Vitals:   03/09/16 2122 03/10/16 0354 03/10/16 0940 03/10/16 1041  BP: 116/59 100/59  101/64  Pulse: 78 52 57 58  Temp: 98.2 F (36.8 C) 98.6 F (37 C)    TempSrc: Oral Oral    Resp:  19    Height:      Weight:      SpO2: 93% 95% 91% 93%    PHYSICAL EXAM:  Right shoulder: Patient's dressing is clean dry and intact. She can flex and extend her digits on the right hand. Her hand is well-perfused with a palpable radial pulse. She has intact sensation light touch throughout the right upper extremity.  Left leg: Patient has intact sensation light touch in her toes and foot today. She still is faint paresthesias in her ankle and distal lower leg. She is 55 strength in all muscle groups. She has palpable pedal pulses and her foot is well perfused.  LABS  No results found for this or any previous visit (from the past 24 hour(s)).  No results found.  Assessment/Plan: 1 Day Post-Op   Active Problems:   Full thickness rotator cuff tear  Patient is doing well now postop. She was able to get up out of bed with therapy today. She will be discharged home. She'll follow up with me in 1 week. She must continue to use the sling at all times at home. She'll move the bandage in 3 days. She is not to actively abduct or forward elevate her shoulder.    Juanell FairlyKRASINSKI, Collie Kittel , MD 03/10/2016, 12:52 PM

## 2016-03-10 NOTE — Evaluation (Signed)
Physical Therapy Evaluation Patient Details Name: Veronica Olsen MRN: [id number] DOB: 1941/04/04 Today's Date: 03/10/2016   History of Present Illness  Pt is a 74 y.o. female s/p R shoulder arthroscopy with mini-open rotator cuff repair, subacromial decompression, and distal clavicle acrominectomy secondary to full thickness rotator cuff tear.  Pt with weakness and nausea post-op and admitted overnight under observation.  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  Clinical Impression  Prior to admission, pt was independent ambulating without AD.  Pt plans to discharge to her daughter's home which is 1 level with stairs to enter.  Pt initially mildly unsteady beginning of session but with continued functional mobility and ambulation during session pt's steadiness and confidence improved and pt then SBA with transfers and ambulation no AD and CGA navigating stairs with railing.  Pt would benefit from skilled PT to address noted impairments and functional limitations during hospital stay.  Recommend pt discharge to home with support of family (assist for functional mobility for safety initially) when medically appropriate.     Follow Up Recommendations Supervision for mobility/OOB (follow-up with surgeon for OP PT needs post-op)    Equipment Recommendations  None recommended by PT    Recommendations for Other Services       Precautions / Restrictions Precautions Precautions: Fall;Shoulder Type of Shoulder Precautions: No lifting 12-16 weeks. Shoulder Interventions: Shoulder sling/immobilizer;Shoulder abduction pillow;At all times Required Braces or Orthoses: Sling Restrictions Weight Bearing Restrictions: Yes RUE Weight Bearing: Non weight bearing      Mobility  Bed Mobility Overal bed mobility: Needs Assistance Bed Mobility: Supine to Sit     Supine to sit: Supervision;HOB elevated     General bed mobility comments: use of side  rail; steady and performed without difficulty  Transfers Overall transfer level: Needs assistance Equipment used: None Transfers: Sit to/from Stand Sit to Stand: Min guard;Supervision         General transfer comment: initially CGA to stand but progressed to SBA; steady without loss of balance  Ambulation/Gait Ambulation/Gait assistance: Min guard;Supervision Ambulation Distance (Feet): 220 Feet Assistive device:  (initially HHA x90 feet then no AD rest of ambulation)   Gait velocity: initially decreased but improved to normal speed for pt's age with distance   General Gait Details: initially increased BOS and decreased B step length/foot clearance but improved with distance and confidence; no loss of balance noted with ambulation without UE support  Stairs Stairs: Yes Stairs assistance: Min guard Stair Management: One rail Right;One rail Left;Step to pattern;Forwards (using L UE only on railing) Number of Stairs: 4 General stair comments: initial vc's for technique and then pt able to perform on own without further cueing  Wheelchair Mobility    Modified Rankin (Stroke Patients Only)       Balance Overall balance assessment: Needs assistance Sitting-balance support: Single extremity supported;Feet supported Sitting balance-Leahy Scale: Good     Standing balance support: No upper extremity supported;During functional activity Standing balance-Leahy Scale: Good Standing balance comment: improved balance with distance ambulated                             Pertinent Vitals/Pain Pain Assessment: 0-10 Pain Score: 4  Pain Location: R shoulder Pain Descriptors / Indicators: Sore;Tender Pain Intervention(s): Limited activity within patient's tolerance;Monitored during session;Premedicated before session;Repositioned  See flow sheet for HR and O2 vitals.    Home Living Family/patient expects to be  discharged to:: Private residence Living Arrangements: Alone  (will be staying with daughter (home set-up listed is for daughter's home)) Available Help at Discharge: Family Type of Home: House Home Access: Stairs to enter   [employer] of Steps: 5 with railing in middle Home Layout: One level Home Equipment: None      Prior Function Level of Independence: Independent         Comments: Pt plans to sleep in lift recliner at daughter's home; 1 fall tripping over board in past 6 months     Hand Dominance        Extremity/Trunk Assessment   Upper Extremity Assessment: Defer to OT evaluation           Lower Extremity Assessment: RLE deficits/detail;LLE deficits/detail RLE Deficits / Details: R LE strength and ROM WFL LLE Deficits / Details: L LE strength and ROM WFL; decreased sensation L plantar surface of heel and great toe.     Communication   Communication: No difficulties  Cognition Arousal/Alertness: Awake/alert Behavior During Therapy: WFL for tasks assessed/performed Overall Cognitive Status: Within Functional Limits for tasks assessed                      General Comments General comments (skin integrity, edema, etc.): R UE sling requiring re-adjustment prior to functional mobility (straps not attached and not supporting shoulder for OOB mobility); re-adjustment performed prior to mobility by PT  Pt agreeable to PT session.  Pt's daughter and sister present during session.    Exercises        Assessment/Plan    PT Assessment Patient needs continued PT services  PT Diagnosis Difficulty walking;Acute pain   PT Problem List Decreased balance;Decreased mobility;Decreased knowledge of precautions;Pain  PT Treatment Interventions DME instruction;Gait training;Stair training;Functional mobility training;Therapeutic activities;Therapeutic exercise;Balance training;Patient/family education   PT Goals (Current goals can be found in the Care Plan section) Acute Rehab PT Goals Patient Stated Goal: to go to  daughter's home PT Goal Formulation: With patient/family Time For Goal Achievement: 03/24/16 Potential to Achieve Goals: Good    Frequency BID   Barriers to discharge        Co-evaluation               End of Session Equipment Utilized During Treatment: Gait belt;Other (comment) (R UE sling/immobilizer) Activity Tolerance: Patient tolerated treatment well Patient left: in chair;with call bell/phone within reach;with chair alarm set;with family/visitor present (OT present and reported she would attach polar care end of her session) Nurse Communication: Mobility status;Precautions    Functional Assessment Tool Used: AM-PAC with stairs Functional Limitation: Mobility: Walking and moving around Mobility: Walking and Moving Around Current Status (W0981): At least 40 percent but less than 60 percent impaired, limited or restricted Mobility: Walking and Moving Around Goal Status [phone]): At least 1 percent but less than 20 percent impaired, limited or restricted    Time: 0905-0943 PT Time Calculation (min) (ACUTE ONLY): 38 min   Charges:   PT Evaluation $PT Eval Moderate Complexity: 1 Procedure PT Treatments $Gait Training: 8-22 mins   PT G Codes:   PT G-Codes **NOT FOR INPATIENT CLASS** Functional Assessment Tool Used: AM-PAC with stairs Functional Limitation: Mobility: Walking and moving around Mobility: Walking and Moving Around Current Status (W2956): At least 40 percent but less than 60 percent impaired, limited or restricted Mobility: Walking and Moving Around Goal Status [phone]): At least 1 percent but less than 20 percent impaired, limited or restricted    Irving Burton  Etna Forquer 03/10/2016, 10:13 AM Hendricks LimesEmily Haili Donofrio, PT [phone]

## 2016-03-10 NOTE — Discharge Summary (Signed)
Physician Discharge Summary  Patient ID: Veronica Olsen Yanda MRN: [id number] DOB/AGE: 73/10/1940 73 y.o.  Admit date: 03/09/2016 Discharge date: 03/10/2016  Admission Diagnoses:  M75.121 Complete rotatr-cuff tear/ruptr of r shoulder, not trauma  Discharge Diagnoses:  M75.121 Complete rotatr-cuff tear/ruptr of r shoulder, not trauma Active Problems:   Full thickness rotator cuff tear   History reviewed. No pertinent past medical history.  Surgeries: Procedure(s): SHOULDER ARTHROSCOPY WITH OPEN ROTATOR CUFF REPAIR AND DISTAL CLAVICLE ACROMINECTOMY on 03/09/2016   Consultants (if any):    Discharged Condition: Improved  Hospital Course: Veronica Olsen Veronica Olsen is an 75 y.o. female who was admitted 03/09/2016 with a diagnosis of  M75.121 Complete rotatr-cuff tear/ruptr of r shoulder, not trauma  and went to the operating room on 03/09/2016 and underwent Successful right shoulder mini open rotator cuff repair. Patient had pain, difficulty with balance and the sensory peroneal nerve palsy of the left leg following surgery. For these reasons she was admitted to observation status overnight postop.    She was given perioperative antibiotics:  Anti-infectives    Start     Dose/Rate Route Frequency Ordered Stop   03/09/16 0540  ceFAZolin (ANCEF) 2-4 GM/100ML-% IVPB    Comments:  STOLLEY, LORI: cabinet override      03/09/16 0540 03/09/16 1744   03/09/16 0325  ceFAZolin (ANCEF) IVPB 2g/100 mL premix     2 g 200 mL/hr over 30 Minutes Intravenous On call to O.R. 03/09/16 [phone] 03/09/16 0757    .  She was given sequential compression devices, early ambulation for DVT prophylaxis.  She benefited maximally from the hospital stay and there were no complications.    Recent vital signs:  Filed Vitals:   03/10/16 0940 03/10/16 1041  BP:  101/64  Pulse: 57 58  Temp:    Resp:      Recent laboratory studies:  No results found for: HGB No results found for: WBC, PLT Lab Results  Component Value  Date   INR 1.01 03/07/2016   No results found for: NA, K, CL, CO2, BUN, CREATININE, GLUCOSE  Discharge Medications:     Medication List    TAKE these medications        ibuprofen 200 MG tablet  Commonly known as:  ADVIL,MOTRIN  Take 200 mg by mouth every 6 (six) hours as needed.     ondansetron 4 MG tablet  Commonly known as:  ZOFRAN  Take 1 tablet (4 mg total) by mouth every 8 (eight) hours as needed for nausea or vomiting.     oxyCODONE 5 MG immediate release tablet  Commonly known as:  Oxy IR/ROXICODONE  Take 1-2 tablets (5-10 mg total) by mouth every 4 (four) hours as needed for severe pain.        Diagnostic Studies: No results found.  Disposition: 01-Home or Self Care      Discharge Instructions    Call MD / Call 911    Complete by:  As directed   If you experience chest pain or shortness of breath, CALL 911 and be transported to the hospital emergency room.  If you develope a fever above 101 F, pus (white drainage) or increased drainage or redness at the wound, or calf pain, call your surgeon's office.     Call MD / Call 911    Complete by:  As directed   If you experience chest pain or shortness of breath, CALL 911 and be transported to the hospital emergency room.  If you develope a fever  above 101 F, pus (white drainage) or increased drainage or redness at the wound, or calf pain, call your surgeon's office.     Constipation Prevention    Complete by:  As directed   Drink plenty of fluids.  Prune juice may be helpful.  You may use a stool softener, such as Colace (over the counter) 100 mg twice a day.  Use MiraLax (over the counter) for constipation as needed.     Constipation Prevention    Complete by:  As directed   Drink plenty of fluids.  Prune juice may be helpful.  You may use a stool softener, such as Colace (over the counter) 100 mg twice a day.  Use MiraLax (over the counter) for constipation as needed.     Diet general    Complete by:  As directed       Diet general    Complete by:  As directed      Discharge instructions    Complete by:  As directed   Wear sling at all times, including sleep.  You will need to use the sling for a total of 4 weeks following surgery.  Do not try and lift your arm away from your body for any reason.   Keep the dressing dry.  You may remove bandage in 3 days.  Leave the Steri-Strips (white medical tape) in place.  You may place additional Band-Aids over top of the Steri-Strips if you wish.  May shower once dressing is removed in 3 days.  Remove sling carefully only for showers, leaving arm down by your side while in the shower.  Make sure to take some pain medication this evening before you fall asleep, in preparation for the nerve block wearing off in the middle of the night.  If the the pain medication causes itching, or is too strong, try taking a single tablet at a time, or combining with Benadryl.  You may be most comfortable sleeping in a recliner.  If you do sleep in near bed, placed pillows behind the shoulder that have the operation to support it.     Discharge instructions    Complete by:  As directed   Wear sling at all times, including sleep.  You will need to use the sling for a total of 4 weeks following surgery.  Do not try and lift your arm away from your body for any reason.   Keep the dressing dry.  You may remove bandage in 3 days.  Leave the Steri-Strips (white medical tape) in place.  You may place additional Band-Aids over top of the Steri-Strips if you wish.  May shower once dressing is removed in 3 days.  Remove sling carefully only for showers, leaving arm down by your side while in the shower.  Make sure to take some pain medication this evening before you fall asleep, in preparation for the nerve block wearing off in the middle of the night.  If the the pain medication causes itching, or is too strong, try taking a single tablet at a time, or combining with Benadryl.  You may  be most comfortable sleeping in a recliner.  If you do sleep in near bed, placed pillows behind the shoulder that have the operation to support it.     Driving restrictions    Complete by:  As directed   No driving for 4 weeks     Driving restrictions    Complete by:  As directed   No driving  for 4 weeks     Increase activity slowly as tolerated    Complete by:  As directed      Increase activity slowly as tolerated    Complete by:  As directed      Lifting restrictions    Complete by:  As directed   No lifting for 12-16 weeks     Lifting restrictions    Complete by:  As directed   No lifting for 12-16 weeks           Follow-up Information    Follow up with Juanell Fairly, MD On 03/16/2016.   Specialty:  Orthopedic Surgery   Why:  @4 :15pm in [facility] office   Contact information:   [address] [place] [address] [phone]        Signed: Juanell Fairly ,MD 03/10/2016, 12:57 PM

## 2016-03-10 NOTE — Evaluation (Signed)
Occupational Therapy Evaluation Patient Details Name: Veronica FredricksonMarjorie M Olsen MRN: [id number] DOB: 12/12/1940 Today's Date: 03/10/2016    History of Present Illness Pt is a 75 y.o. female s/p R shoulder arthroscopy with mini-open rotator cuff repair, subacromial decompression, and distal clavicle acrominectomy secondary to full thickness rotator cuff tear.  Pt with weakness and nausea post-op and admitted overnight under observation.  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   Clinical Impression   Pt. Is a 75 y.o. Female who was admitted for a Right shoulder arthroscopy, RTC repair, subacromial decompression, and clavicle crominectomy. Pt. presents with pain, limited RUE functional use, and decreased activity tolerance which hinders her ability to complete ADl and IADL functioning. Pt. Could benefit from skilled OT services to improve ADL and IADL functioning in order to return to her PLOF.    Follow Up Recommendations  Home health OT    Equipment Recommendations       Recommendations for Other Services PT consult     Precautions / Restrictions Precautions Precautions: Fall;Shoulder Type of Shoulder Precautions: No lifting 12-16 weeks. Shoulder Interventions: Shoulder sling/immobilizer;Shoulder abduction pillow;At all times Required Braces or Orthoses: Sling Restrictions Weight Bearing Restrictions: Yes RUE Weight Bearing: Non weight bearing      Mobility Bed Mobility Overal bed mobility: Needs Assistance Bed Mobility: Supine to Sit     Supine to sit: Supervision;HOB elevated     General bed mobility comments: use of side rail; steady and performed without difficulty  Transfers Overall transfer level: Needs assistance Equipment used: None Transfers: Sit to/from Stand Sit to Stand: Min guard;Supervision         General transfer comment: initially CGA to stand but progressed to SBA; steady without loss of balance     Balance Overall balance assessment: Needs assistance Sitting-balance support: Single extremity supported;Feet supported Sitting balance-Leahy Scale: Good     Standing balance support: No upper extremity supported;During functional activity Standing balance-Leahy Scale: Good Standing balance comment: improved balance with distance ambulated                            ADL Overall ADL's : Needs assistance/impaired Eating/Feeding: Set up   Grooming: Set up           Upper Body Dressing : Maximal assistance. Pt. And family ed was provided about One armed dressing techniques.    Lower Body Dressing: Maximal assistance. Pt. And family were educated about A/E use for LE dressing.                       Vision     Perception     Praxis      Pertinent Vitals/Pain Pain Assessment: 0-10 Pain Score: 4  Pain Location: Right shoulder Pain Descriptors / Indicators: Sore;Tender Pain Intervention(s): Limited activity within patient's tolerance     Hand Dominance     Extremity/Trunk Assessment Upper Extremity Assessment Upper Extremity Assessment: RUE deficits/detail;LUE deficits/detail RUE: Unable to fully assess due to immobilization LUE Deficits / Details: LUE WFL     Communication Communication Communication: No difficulties   Cognition Arousal/Alertness: Awake/alert Behavior During Therapy: WFL for tasks assessed/performed Overall Cognitive Status: Within Functional Limits for tasks assessed                     General Comments       Exercises       Shoulder  Instructions      Home Living Family/patient expects to be discharged to:: Private residence Living Arrangements: Alone Available Help at Discharge: Family Type of Home: House Home Access: Stairs to enter [employer] of Steps: 2 steps no rails   Home Layout: One level     Bathroom Shower/Tub: Tub/shower unit;Curtain Shower/tub characteristics: Technical brewer: Standard     Home Equipment: None          Prior Functioning/Environment Level of Independence: Independent        Comments: Pt plans to sleep in lift recliner at daughter's home; 1 fall tripping over board in past 6 months    OT Diagnosis: Generalized weakness;Acute pain   OT Problem List: Decreased strength;Decreased range of motion;Pain;Decreased activity tolerance;Impaired UE functional use;Decreased knowledge of use of DME or AE;Decreased coordination   OT Treatment/Interventions: Self-care/ADL training;Therapeutic activities;Therapeutic exercise;DME and/or AE instruction;Patient/family education;Neuromuscular education    OT Goals(Current goals can be found in the care plan section) Acute Rehab OT Goals Patient Stated Goal: To go to daughter's home following discharge OT Goal Formulation: With patient/family Time For Goal Achievement: 03/11/16 Potential to Achieve Goals: Good  OT Frequency: Min 1X/week   Barriers to D/C:            Co-evaluation              End of Session    Activity Tolerance: Patient tolerated treatment well;Patient limited by pain Patient left: in chair;with call bell/phone within reach;with chair alarm set   Time: 0950-1015 OT Time Calculation (min): 25 min Charges:  OT General Charges $OT Visit: 1 Procedure OT Evaluation $OT Eval Moderate Complexity: 1 Procedure G-Codes: OT G-codes **NOT FOR INPATIENT CLASS** Functional Assessment Tool Used: Clinical judgement based on pt. current functional status Functional Limitation: Self care Self Care Current Status (Z6109): At least 20 percent but less than 40 percent impaired, limited or restricted Self Care Goal Status (U0454): At least 1 percent but less than 20 percent impaired, limited or restricted  Veronica Pesa, MS, OTR/L 03/10/2016, 11:40 AM

## 2016-03-10 NOTE — Care Management Note (Signed)
Case Management Note  Patient Details  Name: Veronica Olsen MRN: [id number] Date of Birth: 01/01/1941  Subjective/Objective:        Discharge to home and will F/U with Dr Martha ClanKrasinski in one week.             Action/Plan:   Expected Discharge Date:  03/09/16               Expected Discharge Plan:     In-House Referral:     Discharge planning Services     Post Acute Care Choice:    Choice offered to:     DME Arranged:    DME Agency:     HH Arranged:    HH Agency:     Status of Service:     Medicare Important Message Given:    Date Medicare IM Given:    Medicare IM give by:    Date Additional Medicare IM Given:    Additional Medicare Important Message give by:     If discussed at Long Length of Stay Meetings, dates discussed:    Additional Comments:  Ricarda Atayde A, RN 03/10/2016, 10:50 AM

## 2016-03-13 DIAGNOSIS — B370 Candidal stomatitis: Secondary | ICD-10-CM | POA: Diagnosis not present

## 2016-03-13 DIAGNOSIS — Z6841 Body Mass Index (BMI) 40.0 and over, adult: Secondary | ICD-10-CM | POA: Diagnosis not present

## 2016-03-21 ENCOUNTER — Ambulatory Visit: Payer: PPO | Attending: Orthopedic Surgery

## 2016-03-21 DIAGNOSIS — M25511 Pain in right shoulder: Secondary | ICD-10-CM | POA: Diagnosis not present

## 2016-03-21 DIAGNOSIS — R293 Abnormal posture: Secondary | ICD-10-CM | POA: Insufficient documentation

## 2016-03-21 NOTE — Therapy (Signed)
[place] [facility] MAIN [facility] SERVICES [address] [place], [place], [address] Phone: [phone]   Fax:  [phone]  Physical Therapy Evaluation  Patient Details  Name: Veronica Olsen MRN: [id number] Date of Birth: [birth year] No Data Recorded  Encounter Date: 03/21/2016      PT End of Session - 03/21/16 0936    Visit Number 1   Number of Visits 25   Date for PT Re-Evaluation 04/18/16   Authorization Type 1/10   PT Start Time 0845   PT Stop Time 0930   PT Time Calculation (min) 45 min   Equipment Utilized During Treatment --  abduction sling   Activity Tolerance Patient tolerated treatment well   Behavior During Therapy [facility] for tasks assessed/performed      History reviewed. No pertinent past medical history.  Past Surgical History  Procedure Laterality Date  . Appendectomy      AGE 42  . Shoulder arthroscopy with open rotator cuff repair and distal clavicle acrominectomy Right 03/09/2016    Procedure: SHOULDER ARTHROSCOPY WITH OPEN ROTATOR CUFF REPAIR AND DISTAL CLAVICLE ACROMINECTOMY;  Surgeon: Juanell Fairly, MD;  Location: [facility] ORS;  Service: Orthopedics;  Laterality: Right;    There were no vitals filed for this visit.       Subjective Assessment - 03/21/16 0857    Subjective pt reports Nov 25 2015, she tripped over a board that covered a threshhold and fell tearing her R RTC . She denies any other falls. she underwent R RTC repair and SAD which went well. she did report having a peronial nerve injury, which has since resolved. she reports she has been doing well at home, she has been adhering to her precautions and wearing her sling. she reports she has had little pain.    Pertinent History (p) R full thickness RTC repair SAD   Diagnostic tests MRI   Patient Stated Goals Pt reports she would like to raise her arm overhead   Currently in Pain? (p) Yes   Pain Score (p) 1    Pain Location (p) Shoulder   Pain Orientation (p)  Right   Pain Descriptors / Indicators (p) Aching   Pain Type (p) Surgical pain            [facility] PT Assessment - 03/21/16 0855    Assessment   Medical Diagnosis R RTC repair   Onset Date/Surgical Date 03/09/16   Hand Dominance Right   Next MD Visit 04/03/16   Precautions   Precautions Shoulder   Shoulder Interventions Shoulder abduction pillow;Shoulder sling/immobilizer;At all times   Required Braces or Orthoses Sling   Restrictions   Weight Bearing Restrictions Yes   RUE Weight Bearing --  PROM only   Balance Screen   Has the patient fallen in the past 6 months Yes   How many times? 1   Has the patient had a decrease in activity level because of a fear of falling?  No   Is the patient reluctant to leave their home because of a fear of falling?  No   Home Environment   Living Environment Private residence   Living Arrangements Alone   Available Help at Discharge Family   Type of Home House   Home Access Level entry   Home Layout One level   Home Equipment None   Prior Function   Level of Independence Independent;Independent with basic ADLs  full shoulder ROM   Vocation Retired   [employer] cares for grand  children          POSTURE/OBSERVATION: Pt sits in no acute distress, R shoulder elevated in sling Well healing incision steri strips applied. No sign of infection  PROM/AROM: R shoulder PROM: Shoulder ER: 10deg,  Shoulder Flexion 80 deg Shoulder abduction 75 deg Elbow flexion AROM: WNL Elbow extension : -5 deg   STRENGTH:  Graded on a 0-5 scale: Deferred due to post op precautions (RUE) Muscle Group Left Right  Shoulder flex 4+   Shoulder Abd 4+   Shoulder Ext 4+   Shoulder IR/ER 4+   Elbow 4+   Wrist/hand  4 4  Hip Flex    Hip Abd    Hip Add    Hip Ext    Hip IR/ER    Knee Flex    Knee Ext    Ankle DF    Ankle PF     SENSATION: WNL Bilaterally  SPECIAL TESTS: none    OUTCOME MEASURES: TEST Outcome Interpretation  Quick  Dash 85% disability  Severe disability                       Therex: Hand, wrist AROM flexion/ extension  X 10 each Elbow supination/pronation x 10 Elbow extension /flexion  X 10 Pendulum x 10 Pt requires min verbal and tactile cues for proper exercise performance                        PT Long Term Goals - 03/21/16 0945    PT LONG TERM GOAL #1   Title pt will demonstrate at least 140 deg R shoulder PROM to return to funciton   Time 4   Period Weeks   Status New   PT LONG TERM GOAL #2   Title pt will be independent and compliant with HEP   Time 8   Period Weeks   Status New   PT LONG TERM GOAL #3   Title pt will demonstrate 140 deg of R shoulder AROM to return to ADLs   Time 12   Period Weeks   Status New   PT LONG TERM GOAL #4   Title pt will be independent and compliant with progressive strengthening program to return to PLOF   Time 12   Period Weeks   Status New   PT LONG TERM GOAL #5   Title pt will be able to improve shoulder IR to 45 deg to be able to fasten her bra   Time 12   Period Weeks   Status New               Plan - 03/21/16 1610    Clinical Impression Statement pt presents doing well s/p R shoulder full thickness RTC repair and SAD performed 03/09/16. she is doing quite well post-operatively with controlled pain and no sign of infection. she is adhering to her post-op restrictions/precautions. pt would benefit from skilled PT services to address pain, ROM, strength and posture to maximize function.    Rehab Potential Good   Clinical Impairments Affecting Rehab Potential age, complexity of surgery   PT Frequency 2x / week   PT Duration 12 weeks   PT Treatment/Interventions ADLs/Self Care Home Management;Aquatic Therapy;Cryotherapy;Electrical Stimulation;Moist Heat;Ultrasound;Patient/family education;Therapeutic exercise;Therapeutic activities;Functional mobility training;Manual techniques;Dry needling;Passive range of  motion;Taping   PT Next Visit Plan PROM as tolerated, pain control   Consulted and Agree with Plan of Care Patient      Patient will benefit  from skilled therapeutic intervention in order to improve the following deficits and impairments:  Decreased activity tolerance, Decreased endurance, Decreased range of motion, Decreased safety awareness, Decreased strength, Hypomobility, Impaired UE functional use, Pain, Impaired sensation  Visit Diagnosis: Pain in right shoulder - Plan: PT plan of care cert/re-cert  Abnormal posture - Plan: PT plan of care cert/re-cert      G-Codes - 03/21/16 [id number]    Functional Assessment Tool Used ROM, clinical judgment, quick dash   Functional Limitation Changing and maintaining body position   Changing and Maintaining Body Position Current Status (H0865(G8981) At least 80 percent but less than 100 percent impaired, limited or restricted   Changing and Maintaining Body Position Goal Status (H8469(G8982) At least 1 percent but less than 20 percent impaired, limited or restricted       Problem List Patient Active Problem List   Diagnosis Date Noted  . Full thickness rotator cuff tear 03/09/2016   Carlyon ShadowAshley C. Kiarra Kidd, PT, DPT [phone]  Ashaun Gaughan 03/21/2016, 9:51 AM  [place] [facility] [address] [place], [place], [address] Phone: [phone]   Fax:  [phone]  Name: Hilarie FredricksonMarjorie M Jurek MRN: [id number] Date of Birth: 06/08/1941

## 2016-03-23 ENCOUNTER — Ambulatory Visit: Payer: PPO | Attending: Orthopedic Surgery

## 2016-03-23 DIAGNOSIS — R293 Abnormal posture: Secondary | ICD-10-CM | POA: Diagnosis not present

## 2016-03-23 DIAGNOSIS — M25511 Pain in right shoulder: Secondary | ICD-10-CM

## 2016-03-23 NOTE — Therapy (Signed)
[place] [facility] MAIN [facility] SERVICES [address] [place], [place], [address] Phone: [phone]   Fax:  [phone]  Physical Therapy Treatment  Patient Details  Name: Veronica Olsen MRN: [id number] Date of Birth: [birth year] No Data Recorded  Encounter Date: 03/23/2016      PT End of Session - 03/23/16 1608    Visit Number 2   Number of Visits 25   Date for PT Re-Evaluation 04/18/16   Authorization Type 2/10   PT Start Time 1600   PT Stop Time 1645   PT Time Calculation (min) 45 min   Equipment Utilized During Treatment --  abduction sling   Activity Tolerance Patient tolerated treatment well   Behavior During Therapy [facility] for tasks assessed/performed      History reviewed. No pertinent past medical history.  Past Surgical History  Procedure Laterality Date  . Appendectomy      AGE 75  . Shoulder arthroscopy with open rotator cuff repair and distal clavicle acrominectomy Right 03/09/2016    Procedure: SHOULDER ARTHROSCOPY WITH OPEN ROTATOR CUFF REPAIR AND DISTAL CLAVICLE ACROMINECTOMY;  Surgeon: Juanell Fairly, MD;  Location: [facility] ORS;  Service: Orthopedics;  Laterality: Right;    There were no vitals filed for this visit.      Subjective Assessment - 03/23/16 1607    Subjective pt reports she has done HEP. she reported trying to do pendulums last night and she felt more pull so she stopped.    Diagnostic tests MRI   Patient Stated Goals Pt reports she would like to raise her arm overhead   Currently in Pain? Yes   Pain Score 1    Pain Location Shoulder   Pain Orientation Right   Pain Descriptors / Indicators Sore   Pain Type Surgical pain   Pain Onset 1 to 4 weeks ago       MHP prior to session (unbilled) for improved soft tissue mobility and muscle relaxation  Manual therapy :  PROM to R shoulder flexion, abduction (scaption slight) ER x 12 repetitions with small oscillations at end range. Mod cues to relax Elbow  extension with gentle over pressure with biceps STM  X 4 min   Cold pack following therapy for pain reduction x 8 min (unbilled)                              PT Long Term Goals - 03/21/16 0945    PT LONG TERM GOAL #1   Title pt will demonstrate at least 140 deg R shoulder PROM to return to funciton   Time 4   Period Weeks   Status New   PT LONG TERM GOAL #2   Title pt will be independent and compliant with HEP   Time 8   Period Weeks   Status New   PT LONG TERM GOAL #3   Title pt will demonstrate 140 deg of R shoulder AROM to return to ADLs   Time 12   Period Weeks   Status New   PT LONG TERM GOAL #4   Title pt will be independent and compliant with progressive strengthening program to return to PLOF   Time 12   Period Weeks   Status New   PT LONG TERM GOAL #5   Title pt will be able to improve shoulder IR to 45 deg to be able to fasten her bra   Time 12   Period  Weeks   Status New               Plan - 03/23/16 1637    Clinical Impression Statement pt did fairly well wiht PROM today. she was able to reach approx 90 deg flexion/abduction and 20 deg ER. pt does need min-mod cues to relax/not assist with ROM. her pain is still controlled. able to achieve full elbow extension today   Rehab Potential Good   Clinical Impairments Affecting Rehab Potential age, complexity of surgery   PT Frequency 2x / week   PT Duration 12 weeks   PT Treatment/Interventions ADLs/Self Care Home Management;Aquatic Therapy;Cryotherapy;Electrical Stimulation;Moist Heat;Ultrasound;Patient/family education;Therapeutic exercise;Therapeutic activities;Functional mobility training;Manual techniques;Dry needling;Passive range of motion;Taping   PT Next Visit Plan PROM as tolerated, pain control   Consulted and Agree with Plan of Care Patient      Patient will benefit from skilled therapeutic intervention in order to improve the following deficits and impairments:   Decreased activity tolerance, Decreased endurance, Decreased range of motion, Decreased safety awareness, Decreased strength, Hypomobility, Impaired UE functional use, Pain, Impaired sensation  Visit Diagnosis: Pain in right shoulder  Abnormal posture     Problem List Patient Active Problem List   Diagnosis Date Noted  . Full thickness rotator cuff tear 03/09/2016   Carlyon ShadowAshley C. Jameya Pontiff, PT, DPT [phone]  Tomoki Lucken 03/23/2016, 4:38 PM  [place] [facility] [address] [place], [place], [address] Phone: [phone]   Fax:  [phone]  Name: Veronica Olsen MRN: [id number] Date of Birth: 06/05/1941

## 2016-03-28 ENCOUNTER — Ambulatory Visit: Payer: PPO

## 2016-03-28 DIAGNOSIS — R293 Abnormal posture: Secondary | ICD-10-CM

## 2016-03-28 DIAGNOSIS — M25511 Pain in right shoulder: Secondary | ICD-10-CM | POA: Diagnosis not present

## 2016-03-28 NOTE — Therapy (Signed)
[place] [facility] [address] [place], [place], [address] Phone: [phone]   Fax:  [phone]  Physical Therapy Treatment  Patient Details  Name: Veronica FredricksonMarjorie M Bushnell MRN: [id number] Date of Birth: 11/16/1940 No Data Recorded  Encounter Date: 03/28/2016      PT End of Session - 03/28/16 0839    Visit Number 3   Number of Visits 25   Date for PT Re-Evaluation 04/18/16   Authorization Type 3/10   PT Start Time 0828   PT Stop Time 0915   PT Time Calculation (min) 47 min   Equipment Utilized During Treatment --  abduction sling   Activity Tolerance Patient tolerated treatment well   Behavior During Therapy [facility] for tasks assessed/performed      History reviewed. No pertinent past medical history.  Past Surgical History  Procedure Laterality Date  . Appendectomy      AGE 75  . Shoulder arthroscopy with open rotator cuff repair and distal clavicle acrominectomy Right 03/09/2016    Procedure: SHOULDER ARTHROSCOPY WITH OPEN ROTATOR CUFF REPAIR AND DISTAL CLAVICLE ACROMINECTOMY;  Surgeon: Juanell FairlyKevin Krasinski, MD;  Location: [facility] ORS;  Service: Orthopedics;  Laterality: Right;    There were no vitals filed for this visit.      Subjective Assessment - 03/28/16 0838    Subjective pt reports she is still doing pretty well. she reports her shoulder is sore, but tolerable. she is compliant with HEP   Diagnostic tests MRI   Patient Stated Goals Pt reports she would like to raise her arm overhead   Currently in Pain? Yes   Pain Score 2    Pain Location Shoulder   Pain Orientation Right   Pain Descriptors / Indicators Sore   Pain Onset 1 to 4 weeks ago      Manual Therapy: PROM performed in flex, abd with scaption, ER/IR x 15 each direction. Pt presented with tightness in all ranges but was able to achieve 75 deg in flex., 85 deg abd with scaption, 15 deg ER and 45 deg IR. Pt c/o "pulling" at end range but tolerated treatment well.   pt required mod verbal/tactile cues to relax during PROM    Therex: Performed sub-max isometrics with very light pressure (2 fingers) for shoulder flexion, ER/IR w/ 6 sec hold x 10 each direction. Pt had c/o slight tightness in biceps during exercises but no shoulder pain. Performed scapular retraction/depression in standing x 15; pt required min verbal and tactile cueing to decrease upper trap compensation throughout exercise.                     PT Education - 03/28/16 0839    Education provided Yes   Education Details isometrics   Person(s) Educated Patient   Methods Explanation   Comprehension Verbalized understanding;Verbal cues required;Returned demonstration             PT Long Term Goals - 03/21/16 0945    PT LONG TERM GOAL #1   Title pt will demonstrate at least 140 deg R shoulder PROM to return to funciton   Time 4   Period Weeks   Status New   PT LONG TERM GOAL #2   Title pt will be independent and compliant with HEP   Time 8   Period Weeks   Status New   PT LONG TERM GOAL #3   Title pt will demonstrate 140 deg of R shoulder AROM to return to ADLs  Time 12   Period Weeks   Status New   PT LONG TERM GOAL #4   Title pt will be independent and compliant with progressive strengthening program to return to PLOF   Time 12   Period Weeks   Status New   PT LONG TERM GOAL #5   Title pt will be able to improve shoulder IR to 45 deg to be able to fasten her bra   Time 12   Period Weeks   Status New               Plan - 03/28/16 0840    Clinical Impression Statement pt progressing well with PROM and adhering to precuations and HEP. initiated sub-max isometrics today without issue   Rehab Potential Good   Clinical Impairments Affecting Rehab Potential age, complexity of surgery   PT Frequency 2x / week   PT Duration 12 weeks   PT Treatment/Interventions ADLs/Self Care Home Management;Aquatic Therapy;Cryotherapy;Electrical  Stimulation;Moist Heat;Ultrasound;Patient/family education;Therapeutic exercise;Therapeutic activities;Functional mobility training;Manual techniques;Dry needling;Passive range of motion;Taping   PT Next Visit Plan PROM as tolerated, pain control   Consulted and Agree with Plan of Care Patient      Patient will benefit from skilled therapeutic intervention in order to improve the following deficits and impairments:  Decreased activity tolerance, Decreased endurance, Decreased range of motion, Decreased safety awareness, Decreased strength, Hypomobility, Impaired UE functional use, Pain, Impaired sensation  Visit Diagnosis: Abnormal posture     Problem List Patient Active Problem List   Diagnosis Date Noted  . Full thickness rotator cuff tear 03/09/2016   Carlyon Shadow. Charlotta Lapaglia, PT, DPT [phone]  Cully Luckow 03/28/2016, 9:11 AM  [place] [facility] MAIN [facility] SERVICES [address] [place], [place], [address] Phone: [phone]   Fax:  [phone]  Name: Veronica Olsen MRN: [id number] Date of Birth: 06/30/1941

## 2016-03-30 ENCOUNTER — Ambulatory Visit: Payer: PPO | Admitting: Physical Therapy

## 2016-03-30 ENCOUNTER — Encounter: Payer: Self-pay | Admitting: Physical Therapy

## 2016-03-30 DIAGNOSIS — R293 Abnormal posture: Secondary | ICD-10-CM

## 2016-03-30 DIAGNOSIS — M25511 Pain in right shoulder: Secondary | ICD-10-CM

## 2016-03-30 NOTE — Therapy (Signed)
[place] [facility] [address] [place], [place], [address] Phone: [phone]   Fax:  [phone]  Physical Therapy Treatment  Patient Details  Name: Veronica Olsen MRN: [id number] Date of Birth: 12/18/1940 No Data Recorded  Encounter Date: 03/30/2016      PT End of Session - 03/30/16 1420    Visit Number 4   Number of Visits 25   Date for PT Re-Evaluation 04/18/16   Authorization Type 4/10   PT Start Time 1345   PT Stop Time 1430   PT Time Calculation (min) 45 min   Equipment Utilized During Treatment --  abduction sling   Activity Tolerance Patient tolerated treatment well   Behavior During Therapy [facility] for tasks assessed/performed      History reviewed. No pertinent past medical history.  Past Surgical History  Procedure Laterality Date  . Appendectomy      AGE 75  . Shoulder arthroscopy with open rotator cuff repair and distal clavicle acrominectomy Right 03/09/2016    Procedure: SHOULDER ARTHROSCOPY WITH OPEN ROTATOR CUFF REPAIR AND DISTAL CLAVICLE ACROMINECTOMY;  Surgeon: Juanell FairlyKevin Krasinski, MD;  Location: [facility] ORS;  Service: Orthopedics;  Laterality: Right;    There were no vitals filed for this visit.      Subjective Assessment - 03/30/16 1419    Subjective Patient reports increased soreness today; "I don't think that I took my pain medicine as early as I usually do. I don't know if that is what it is, or if its just getting sore."    Diagnostic tests MRI   Patient Stated Goals Pt reports she would like to raise her arm overhead   Currently in Pain? Yes   Pain Score 5    Pain Location Shoulder   Pain Orientation Right   Pain Descriptors / Indicators Aching;Sore   Pain Type Surgical pain   Pain Onset 1 to 4 weeks ago       TREATMENT: PT performed PROM of RUE shoulder in reclined position: Shoulder flexion, abduction, horizontal adduction/abduction, IR/ER, extension, x10 each; Patient required min-mod  VCs to relax during PROM and to avoid shoulder elevation; She had difficulty relaxing with passive shoulder flexion/abduction and ER due to increased discomfort;  PT performed gentle manual resistance for sub-max resistance for RUE shoulder: flexion, extension, abduction, IR/ER and elbow flexion/extension 3 sec hold x5 each; Patient had most difficulty with shoulder flexion, elbow extension and shoulder ER due to weakness; Patient reports increased RUE shoulder discomfort with end range of motion with flexion/abduction;  PT applied interferential TENs, to right shoulder, at tolerated intensity x15 min concurrently with cryotherapy; Patient reports less pain after TENs.                           PT Education - 03/30/16 1419    Education provided Yes   Education Details shoulder ROM/isometrics/TENs/ice   Person(s) Educated Patient   Methods Explanation;Verbal cues   Comprehension Verbalized understanding;Returned demonstration;Verbal cues required             PT Long Term Goals - 03/21/16 0945    PT LONG TERM GOAL #1   Title pt will demonstrate at least 140 deg R shoulder PROM to return to funciton   Time 4   Period Weeks   Status New   PT LONG TERM GOAL #2   Title pt will be independent and compliant with HEP   Time 8   Period  Weeks   Status New   PT LONG TERM GOAL #3   Title pt will demonstrate 140 deg of R shoulder AROM to return to ADLs   Time 12   Period Weeks   Status New   PT LONG TERM GOAL #4   Title pt will be independent and compliant with progressive strengthening program to return to PLOF   Time 12   Period Weeks   Status New   PT LONG TERM GOAL #5   Title pt will be able to improve shoulder IR to 45 deg to be able to fasten her bra   Time 12   Period Weeks   Status New               Plan - 03/30/16 1452    Clinical Impression Statement Patient reports increased right shoulder discomfort today; PT performed PROM in all  directions; she cotninues to require cues to relax during PROM for better tolerance of end range; Patient also instructed in isometrics; She reports no increase in pain with exercise; Patient responded well with TENs with less shoulder discomfort; She would benefit from additional skilled PT intervention to improve shoulder ROM and reduce pain with ADLs.    Rehab Potential Good   Clinical Impairments Affecting Rehab Potential age, complexity of surgery   PT Frequency 2x / week   PT Duration 12 weeks   PT Treatment/Interventions ADLs/Self Care Home Management;Aquatic Therapy;Cryotherapy;Electrical Stimulation;Moist Heat;Ultrasound;Patient/family education;Therapeutic exercise;Therapeutic activities;Functional mobility training;Manual techniques;Dry needling;Passive range of motion;Taping   PT Next Visit Plan PROM as tolerated, pain control   Consulted and Agree with Plan of Care Patient      Patient will benefit from skilled therapeutic intervention in order to improve the following deficits and impairments:  Decreased activity tolerance, Decreased endurance, Decreased range of motion, Decreased safety awareness, Decreased strength, Hypomobility, Impaired UE functional use, Pain, Impaired sensation  Visit Diagnosis: Abnormal posture  Pain in right shoulder     Problem List Patient Active Problem List   Diagnosis Date Noted  . Full thickness rotator cuff tear 03/09/2016    Veronica Olsen PT, DPT 03/30/2016, 2:55 PM  [place] [facility] MAIN [facility] SERVICES [address] [place], [place], [address] Phone: [phone]   Fax:  [phone]  Name: Veronica Olsen MRN: [id number] Date of Birth: 10/20/1941

## 2016-04-03 DIAGNOSIS — S8265XA Nondisplaced fracture of lateral malleolus of left fibula, initial encounter for closed fracture: Secondary | ICD-10-CM | POA: Diagnosis not present

## 2016-04-04 ENCOUNTER — Ambulatory Visit: Payer: PPO

## 2016-04-04 DIAGNOSIS — M25511 Pain in right shoulder: Secondary | ICD-10-CM

## 2016-04-04 DIAGNOSIS — R293 Abnormal posture: Secondary | ICD-10-CM

## 2016-04-04 NOTE — Therapy (Signed)
[facility] [facility] MAIN [facility] SERVICES [address] [place], [place], [address] Phone: [phone]   Fax:  [phone]  Physical Therapy Treatment  Patient Details  Name: Veronica Olsen MRN: [id number] Date of Birth: Dec 12, 1940 No Data Recorded  Encounter Date: 04/04/2016      PT End of Session - 04/04/16 1528    Visit Number 5   Number of Visits 25   Date for PT Re-Evaluation 04/18/16   Authorization Type 5/10   PT Start Time 1350   PT Stop Time 1420   PT Time Calculation (min) 30 min   Activity Tolerance Patient tolerated treatment well   Behavior During Therapy [facility] for tasks assessed/performed      History reviewed. No pertinent past medical history.  Past Surgical History  Procedure Laterality Date  . Appendectomy      AGE 76  . Shoulder arthroscopy with open rotator cuff repair and distal clavicle acrominectomy Right 03/09/2016    Procedure: SHOULDER ARTHROSCOPY WITH OPEN ROTATOR CUFF REPAIR AND DISTAL CLAVICLE ACROMINECTOMY;  Surgeon: Juanell Fairly, MD;  Location: [facility] ORS;  Service: Orthopedics;  Laterality: Right;    There were no vitals filed for this visit.      Subjective Assessment - 04/04/16 1527    Subjective pt reports she had follow up with Dr. Martha Clan which went well. he is happy with her progress and has removed the abduction sling.    Diagnostic tests MRI   Patient Stated Goals Pt reports she would like to raise her arm overhead   Currently in Pain? Yes   Pain Score 1    Pain Location Shoulder   Pain Orientation Right   Pain Descriptors / Indicators Aching;Sore   Pain Type Surgical pain   Pain Onset 1 to 4 weeks ago        PROM performed in flex, abd with scaption, ER/IR x 15 each direction. Pt presented with tightness in all ranges but was able to achieve 75 deg in flex., 85 deg abd with scaption, 15 deg ER and 45 deg IR. Pt c/o "pulling" at end range but tolerated treatment well.  pt required mod  verbal/tactile cues to relax during PROM Soft tissue massage to R distal biceps muscle belly due to pain and soft tissue restriction x 4 min Performed sub-max isometrics with very light pressure (2 fingers) for shoulder flexion, ER/IR, adduction and extension w/ 6 sec hold x 10 each direction. Pt had c/o slight tightness in biceps during exercises but no shoulder pain. Performed scapular retraction/depression in standing x 15; pt required min verbal and tactile cueing to decrease upper trap compensation throughout exercise.     ice applied to r shoulder in sitting x 5 min no charge                      PT Education - 04/04/16 1528    Education provided Yes   Education Details passive biceps stretch in supine/ or off recliner arm   Person(s) Educated Patient   Methods Explanation   Comprehension Verbalized understanding;Returned demonstration             PT Long Term Goals - 03/21/16 0945    PT LONG TERM GOAL #1   Title pt will demonstrate at least 140 deg R shoulder PROM to return to funciton   Time 4   Period Weeks   Status New   PT LONG TERM GOAL #2   Title pt will be  independent and compliant with HEP   Time 8   Period Weeks   Status New   PT LONG TERM GOAL #3   Title pt will demonstrate 140 deg of R shoulder AROM to return to ADLs   Time 12   Period Weeks   Status New   PT LONG TERM GOAL #4   Title pt will be independent and compliant with progressive strengthening program to return to PLOF   Time 12   Period Weeks   Status New   PT LONG TERM GOAL #5   Title pt will be able to improve shoulder IR to 45 deg to be able to fasten her bra   Time 12   Period Weeks   Status New               Plan - 04/04/16 1529    Clinical Impression Statement pt pain level has remained relatively low despite DC her sling. shoulder flexion PROM improved today to approx 110-120 deg. pt continues to tolerate submax isometrics well and improved ability to  perform shoulder retraction. plan to initiate AAROM late next week.    Rehab Potential Good   Clinical Impairments Affecting Rehab Potential age, complexity of surgery   PT Frequency 2x / week   PT Duration 12 weeks   PT Treatment/Interventions ADLs/Self Care Home Management;Aquatic Therapy;Cryotherapy;Electrical Stimulation;Moist Heat;Ultrasound;Patient/family education;Therapeutic exercise;Therapeutic activities;Functional mobility training;Manual techniques;Dry needling;Passive range of motion;Taping   PT Next Visit Plan PROM as tolerated, pain control   Consulted and Agree with Plan of Care Patient      Patient will benefit from skilled therapeutic intervention in order to improve the following deficits and impairments:  Decreased activity tolerance, Decreased endurance, Decreased range of motion, Decreased safety awareness, Decreased strength, Hypomobility, Impaired UE functional use, Pain, Impaired sensation  Visit Diagnosis: Abnormal posture  Pain in right shoulder     Problem List Patient Active Problem List   Diagnosis Date Noted  . Full thickness rotator cuff tear 03/09/2016   Carlyon ShadowAshley C. Semaj Kham, PT, DPT [phone]  Midas Daughety 04/04/2016, 3:30 PM  [place] [facility] [address] [place], [place], [address] Phone: [phone]   Fax:  [phone]  Name: Veronica Olsen MRN: [id number] Date of Birth: 05/26/1941

## 2016-04-06 ENCOUNTER — Encounter: Payer: Self-pay | Admitting: Physical Therapy

## 2016-04-06 ENCOUNTER — Ambulatory Visit: Payer: PPO | Admitting: Physical Therapy

## 2016-04-06 DIAGNOSIS — M25511 Pain in right shoulder: Secondary | ICD-10-CM | POA: Diagnosis not present

## 2016-04-06 DIAGNOSIS — R293 Abnormal posture: Secondary | ICD-10-CM

## 2016-04-06 NOTE — Therapy (Signed)
[place] [facility] MAIN [facility] SERVICES [address] [place], [place], [address] Phone: [phone]   Fax:  [phone]  Physical Therapy Treatment  Patient Details  Name: Veronica Olsen MRN: [id number] Date of Birth: April 23, 1941 No Data Recorded  Encounter Date: 04/06/2016      PT End of Session - 04/06/16 1354    Visit Number 6   Number of Visits 25   Date for PT Re-Evaluation 04/18/16   Authorization Type 5/10   PT Start Time 1303   PT Stop Time 1348   PT Time Calculation (min) 45 min   Activity Tolerance Patient tolerated treatment well   Behavior During Therapy [facility] for tasks assessed/performed      History reviewed. No pertinent past medical history.  Past Surgical History  Procedure Laterality Date  . Appendectomy      AGE 71  . Shoulder arthroscopy with open rotator cuff repair and distal clavicle acrominectomy Right 03/09/2016    Procedure: SHOULDER ARTHROSCOPY WITH OPEN ROTATOR CUFF REPAIR AND DISTAL CLAVICLE ACROMINECTOMY;  Surgeon: Juanell Fairly, MD;  Location: [facility] ORS;  Service: Orthopedics;  Laterality: Right;    There were no vitals filed for this visit.      Subjective Assessment - 04/06/16 1304    Subjective Pt again reports she had follow up with Dr. Martha Clan, Monday, which went well; the abduction wedge was discontinued; patient reports doing passive biceps stretch by letting arm relax off the edge of the recliner. Patient also stated that she took pain meds before coming today.    Diagnostic tests MRI   Patient Stated Goals Pt reports she would like to raise her arm overhead   Currently in Pain? No/denies   Pain Onset 1 to 4 weeks ago        Treatment:  Patient in reclined position throughout treatment.   SPT performed PROM for 5-6 minutes each direction including: Shoulder flexion, abduction in scaption, horizontal abduction/adduction, extension, IR, and ER and elbow flexion and extension. Patient  complained of slight discomfort with horizontal adduction initially but after 2-3 movements noted none.    Throughout PROM patient had difficulty relaxing, especially moving into flexion, abduction in scaption, and ER.   Submaximal isometrics with patient's arm in scaption and elbow flexed at 90 degrees.  Isometrics were done 2 sets in each position; contraction held for 5 seconds with 2 finger pressure from SPT. Instructed patient to perform isometrics for shoulder ER, IR, flexion, ABD/ADD, extension, and shoulder flexion and extension.  Patient reported mild discomfort with shoulder flexion isometrics.  Multiple cues given  to have patient perform submaximal exercises instead of performing a greater contraction.  Cold ice pack x 5-7 minutes; unbilled.                             PT Education - 04/06/16 1352    Education provided Yes   Education Details submaximal contraction, continuation with passive bicep stretch,   Person(s) Educated Patient   Methods Explanation   Comprehension Verbalized understanding             PT Long Term Goals - 03/21/16 0945    PT LONG TERM GOAL #1   Title pt will demonstrate at least 140 deg R shoulder PROM to return to funciton   Time 4   Period Weeks   Status New   PT LONG TERM GOAL #2   Title pt will be independent and  compliant with HEP   Time 8   Period Weeks   Status New   PT LONG TERM GOAL #3   Title pt will demonstrate 140 deg of R shoulder AROM to return to ADLs   Time 12   Period Weeks   Status New   PT LONG TERM GOAL #4   Title pt will be independent and compliant with progressive strengthening program to return to PLOF   Time 12   Period Weeks   Status New   PT LONG TERM GOAL #5   Title pt will be able to improve shoulder IR to 45 deg to be able to fasten her bra   Time 12   Period Weeks   Status New               Plan - 04/06/16 1355    Clinical Impression Statement Upon arrival, patient  is not wearing a sling as instructed by MD; patient ambulations with R arm unsupported, no arm swing, and decreased scapular stabilization (arm hanging). Patient requires constant reminders to relax her arm during PROM in all motions; she attempts to assist the movements. She also requires min VCs to perform submaximal isometrics instead of using a greater contraction. Patient seems to respond to treatment well with no reported increase in pain.  Patient would continue to benefit from skilled PT in order to address pain, limited ROM, and strength.    Rehab Potential Good   Clinical Impairments Affecting Rehab Potential age, complexity of surgery   PT Frequency 2x / week   PT Duration 12 weeks   PT Treatment/Interventions ADLs/Self Care Home Management;Aquatic Therapy;Cryotherapy;Electrical Stimulation;Moist Heat;Ultrasound;Patient/family education;Therapeutic exercise;Therapeutic activities;Functional mobility training;Manual techniques;Dry needling;Passive range of motion;Taping   PT Next Visit Plan PROM as tolerated, pain control   Consulted and Agree with Plan of Care Patient      Patient will benefit from skilled therapeutic intervention in order to improve the following deficits and impairments:  Decreased activity tolerance, Decreased endurance, Decreased range of motion, Decreased safety awareness, Decreased strength, Hypomobility, Impaired UE functional use, Pain, Impaired sensation  Visit Diagnosis: Abnormal posture  Pain in right shoulder     Problem List Patient Active Problem List   Diagnosis Date Noted  . Full thickness rotator cuff tear 03/09/2016   Trula Oreae Draken Farrior, SPT This entire session was performed under direct supervision and direction of a licensed therapist/therapist assistant . I have personally read, edited and approve of the note as written.  Trotter,Margaret PT, DPT 04/06/2016, 3:49 PM  [place] [facility] [address] [place], [place], [address] Phone: [phone]   Fax:  [phone]  Name: Veronica Olsen MRN: [id number] Date of Birth: 08/16/1941

## 2016-04-11 ENCOUNTER — Ambulatory Visit: Payer: PPO | Admitting: Physical Therapy

## 2016-04-11 ENCOUNTER — Encounter: Payer: Self-pay | Admitting: Physical Therapy

## 2016-04-11 DIAGNOSIS — M25511 Pain in right shoulder: Secondary | ICD-10-CM | POA: Diagnosis not present

## 2016-04-11 DIAGNOSIS — R293 Abnormal posture: Secondary | ICD-10-CM

## 2016-04-11 NOTE — Therapy (Signed)
[place] [facility] [address] [place], [place], [address] Phone: [phone]   Fax:  [phone]  Physical Therapy Treatment  Patient Details  Name: Veronica Olsen MRN: [id number] Date of Birth: 05/04/1941 No Data Recorded  Encounter Date: 04/11/2016      PT End of Session - 04/11/16 1259    Visit Number 7   Number of Visits 25   Date for PT Re-Evaluation 04/18/16   Authorization Type 7/10   PT Start Time 1116   PT Stop Time 1156   PT Time Calculation (min) 40 min   Activity Tolerance Patient tolerated treatment well;No increased pain   Behavior During Therapy [facility] for tasks assessed/performed      History reviewed. No pertinent past medical history.  Past Surgical History  Procedure Laterality Date  . Appendectomy      AGE 75  . Shoulder arthroscopy with open rotator cuff repair and distal clavicle acrominectomy Right 03/09/2016    Procedure: SHOULDER ARTHROSCOPY WITH OPEN ROTATOR CUFF REPAIR AND DISTAL CLAVICLE ACROMINECTOMY;  Surgeon: Juanell FairlyKevin Krasinski, MD;  Location: [facility] ORS;  Service: Orthopedics;  Laterality: Right;    There were no vitals filed for this visit.      Subjective Assessment - 04/11/16 1118    Subjective Patient reports having some soreness and pain today.  Patient c/o numbness in R thumb after sling was removed.  Reports doing HEP with no issues or increase in pain.  Patient does report she has a hard time relaxing and catches herself  tensing.    Diagnostic tests MRI   Patient Stated Goals Pt reports she would like to raise her arm overhead   Currently in Pain? Yes   Pain Score 2    Pain Location Shoulder   Pain Orientation Right   Pain Descriptors / Indicators Aching   Pain Onset 1 to 4 weeks ago       Treatment:  Patient in reclined position throughout treatment except for Ther Ex, patient seated at edge of table.  SPT performed PROM for 5-6 minutes each direction  including: Shoulder flexion, abduction in scaption, extension, IR, ER and elbow flexion and extension. Patient noted no discomfort during PROM.    Throughout PROM patient required mod VCs to relax; improved ability to relax compared to last treatment sessionalthough still difficult;  Submaximal isometrics with patient's arm in scaption and elbow flexed at 90 degrees.  Isometrics were done 10 reps in each position; multiple rests and then 6 reps; contraction held for 3 seconds with 2 finger pressure from SPT. Instructed patient to perform isometrics for shoulder ER, IR, flexion, ABD/ADD, extension, and elbow flexion.  Patient reported no increase in pain throughout isometrics.  Minimal cues given to have patient perform submaximal exercises when performing shoulder extension instead of performing a greater contraction.  Patient re-instructed in scapular retractions; 1 x 10, instructed in posterior shoulder rolls, 2 x10 with instruction to not perform if painful.   Cold ice pack x 5 minutes; unbilled.                           PT Education - 04/11/16 1256    Education provided Yes   Education Details submaximal contraction, HEP progression,    Person(s) Educated Patient   Methods Explanation;Demonstration;Verbal cues   Comprehension Returned demonstration;Verbalized understanding             PT Long Term Goals - 03/21/16 0945  PT LONG TERM GOAL #1   Title pt will demonstrate at least 140 deg R shoulder PROM to return to funciton   Time 4   Period Weeks   Status New   PT LONG TERM GOAL #2   Title pt will be independent and compliant with HEP   Time 8   Period Weeks   Status New   PT LONG TERM GOAL #3   Title pt will demonstrate 140 deg of R shoulder AROM to return to ADLs   Time 12   Period Weeks   Status New   PT LONG TERM GOAL #4   Title pt will be independent and compliant with progressive strengthening program to return to PLOF   Time 12    Period Weeks   Status New   PT LONG TERM GOAL #5   Title pt will be able to improve shoulder IR to 45 deg to be able to fasten her bra   Time 12   Period Weeks   Status New               Plan - 04/11/16 1301    Clinical Impression Statement Patient protects R UE against body during ambulation and transfers with decreased movement. Due to patient reported soreness today, AAROM was deferred until Thursday. Patient continues to require moderate VCs to relax during PROM. Patient's submaximal isometrics have improved with minimal VCs to decrease contraction in shoulder extension and to perform only shoulder flexion instead of shoulder flexion and elbow extension. Patient would continue to benefit from skilled PT intervention to address pain, limited ROM, and strength to return to PLOF.    Rehab Potential Good   Clinical Impairments Affecting Rehab Potential age, complexity of surgery   PT Frequency 2x / week   PT Duration 12 weeks   PT Treatment/Interventions ADLs/Self Care Home Management;Aquatic Therapy;Cryotherapy;Electrical Stimulation;Moist Heat;Ultrasound;Patient/family education;Therapeutic exercise;Therapeutic activities;Functional mobility training;Manual techniques;Dry needling;Passive range of motion;Taping   PT Next Visit Plan PROM as tolerated, pain control   Consulted and Agree with Plan of Care Patient      Patient will benefit from skilled therapeutic intervention in order to improve the following deficits and impairments:  Decreased activity tolerance, Decreased endurance, Decreased range of motion, Decreased safety awareness, Decreased strength, Hypomobility, Impaired UE functional use, Pain, Impaired sensation  Visit Diagnosis: Abnormal posture  Pain in right shoulder     Problem List Patient Active Problem List   Diagnosis Date Noted  . Full thickness rotator cuff tear 03/09/2016   Trula Ore, SPT This entire session was performed under direct  supervision and direction of a licensed therapist/therapist assistant . I have personally read, edited and approve of the note as written.  Trotter,Margaret PT, DPT 04/11/2016, 1:47 PM  [place] [facility] MAIN [facility] SERVICES [address] [place], [place], [address] Phone: [phone]   Fax:  [phone]  Name: Veronica Olsen MRN: [id number] Date of Birth: 04-08-1941

## 2016-04-13 ENCOUNTER — Encounter: Payer: Self-pay | Admitting: Physical Therapy

## 2016-04-13 ENCOUNTER — Ambulatory Visit: Payer: PPO | Admitting: Physical Therapy

## 2016-04-13 DIAGNOSIS — R293 Abnormal posture: Secondary | ICD-10-CM

## 2016-04-13 DIAGNOSIS — M25511 Pain in right shoulder: Secondary | ICD-10-CM | POA: Diagnosis not present

## 2016-04-13 NOTE — Therapy (Signed)
[place] [facility] [address] [place], [place], [address] Phone: [phone]   Fax:  [phone]  Physical Therapy Treatment  Patient Details  Name: Veronica Olsen MRN: [id number] Date of Birth: 10/09/1941 No Data Recorded  Encounter Date: 04/13/2016      PT End of Session - 04/13/16 1353    Visit Number 8   Number of Visits 25   Date for PT Re-Evaluation 04/18/16   Authorization Type 8/10   PT Start Time 1258   PT Stop Time 1345   PT Time Calculation (min) 47 min   Activity Tolerance Patient tolerated treatment well   Behavior During Therapy [facility] for tasks assessed/performed      History reviewed. No pertinent past medical history.  Past Surgical History  Procedure Laterality Date  . Appendectomy      AGE 75  . Shoulder arthroscopy with open rotator cuff repair and distal clavicle acrominectomy Right 03/09/2016    Procedure: SHOULDER ARTHROSCOPY WITH OPEN ROTATOR CUFF REPAIR AND DISTAL CLAVICLE ACROMINECTOMY;  Surgeon: Juanell FairlyKevin Krasinski, MD;  Location: [facility] ORS;  Service: Orthopedics;  Laterality: Right;    There were no vitals filed for this visit.      Subjective Assessment - 04/13/16 1300    Subjective Patient reports feeling better since last session.  Patient states numbness in R thumb is still present.  Reports doing HEP with no issues.    Diagnostic tests MRI   Patient Stated Goals Pt reports she would like to raise her arm overhead   Currently in Pain? No/denies   Pain Onset 1 to 4 weeks ago      Treatment:   Patient in reclined position throughout treatment.  SPT performed PROM for 3-4 minutes each direction including: Shoulder flexion, abduction in scaption (patient reports slight pulling feeling), extension, IR, ER, Horizontal ABD/ADD and elbow flexion/extension. Throughout PROM patient required min VCs to relax;   Submaximal isometrics with patient's arm in scaption and elbow flexed at 90  degrees.  Isometrics were done 10 reps in each position; multiple rests and then 5 reps; contraction held for 2-3 seconds with 2 finger pressure from SPT. Instructed patient to perform isometrics for shoulder ER, IR, flexion, ABD/ADD, extension, and elbow flexion.  Patient reported no increase in pain; slight pulling during shoulder abduction.  AAROM initiated today. Patient still in reclined position.  Ranger (from floor at patients waist height) 2 x 10, shoulder flexion/extension, ABD/ADD.  Ranger alphabet x 1 Instructed patient to further her range in order to attain more shoulder motion and less elbow movement.  HEP progressed with wand exercises including flexion, abduction, and chest presses; x 10 reps.  Instructed patient that slight pull was okay but not to push further or into pain.  Throughout wand exercises, patient reports slight pull in all directions.     With AAROM initiation, patient required min-mod cues to achieve correct form and move within comfortable range for safety.                     PT Education - 04/13/16 1353    Education provided Yes   Education Details HEP progression,    Person(s) Educated Patient   Methods Explanation;Demonstration;Verbal cues   Comprehension Verbalized understanding;Returned demonstration             PT Long Term Goals - 03/21/16 0945    PT LONG TERM GOAL #1   Title pt will demonstrate at least 140 deg  R shoulder PROM to return to funciton   Time 4   Period Weeks   Status New   PT LONG TERM GOAL #2   Title pt will be independent and compliant with HEP   Time 8   Period Weeks   Status New   PT LONG TERM GOAL #3   Title pt will demonstrate 140 deg of R shoulder AROM to return to ADLs   Time 12   Period Weeks   Status New   PT LONG TERM GOAL #4   Title pt will be independent and compliant with progressive strengthening program to return to PLOF   Time 12   Period Weeks   Status New   PT LONG TERM  GOAL #5   Title pt will be able to improve shoulder IR to 45 deg to be able to fasten her bra   Time 12   Period Weeks   Status New               Plan - 04/13/16 1354    Clinical Impression Statement Patient exhibits difficulty with relaxing during PROM in all motions. Since she is now aware of when her shoulder is tensing, she can often remind herself to relax her arm; however she still requires min. VCs to relax at times. Min VCs to decrease force to submaximal during isometrics with patient feeling as if she gets "stiff" afterwards (like muscle has worked).  AAROM was initiated today due to patient report of shoulder feeling better; very limited shoulder movement with Ranger due to patient being reclined; with wand flexion, abduction, and chest presses ROM improved.  Patient responded well with slight increase in pain (1/10 from 0/10) with reports of slight pulling but no pain.  Patient would continue to benefit from skilled PT intervention to address pain, limited ROm, and return to PLOF.   Rehab Potential Good   Clinical Impairments Affecting Rehab Potential age, complexity of surgery   PT Frequency 2x / week   PT Duration 12 weeks   PT Treatment/Interventions ADLs/Self Care Home Management;Aquatic Therapy;Cryotherapy;Electrical Stimulation;Moist Heat;Ultrasound;Patient/family education;Therapeutic exercise;Therapeutic activities;Functional mobility training;Manual techniques;Dry needling;Passive range of motion;Taping   PT Next Visit Plan AAROM, isometrics   PT Home Exercise Plan HEP progressed, wand exercises   Consulted and Agree with Plan of Care Patient      Patient will benefit from skilled therapeutic intervention in order to improve the following deficits and impairments:  Decreased activity tolerance, Decreased endurance, Decreased range of motion, Decreased safety awareness, Decreased strength, Hypomobility, Impaired UE functional use, Pain, Impaired sensation  Visit  Diagnosis: Abnormal posture  Pain in right shoulder     Problem List Patient Active Problem List   Diagnosis Date Noted  . Full thickness rotator cuff tear 03/09/2016   Trula Oreae Maycol Hoying, SPT This entire session was performed under direct supervision and direction of a licensed therapist/therapist assistant . I have personally read, edited and approve of the note as written.  Trotter,Margaret PT, DPT 04/13/2016, 2:32 PM  [place] [facility] [address] [place], [place], [address] Phone: [phone]   Fax:  [phone]  Name: Veronica Olsen MRN: [id number] Date of Birth: 10/13/1941

## 2016-04-13 NOTE — Patient Instructions (Signed)
Abduction Lift With Wand    Hold wand palm up on right and palm down on left. Reach right hand to side and toward ceiling for _5___ repetitions. Reverse hand position and repeat to left. Hold __1__ seconds each upward position. Do __2__ sessions per day.  Copyright  VHI. All rights reserved.  Flexion Lift With Wand    Hold wand with palms up. Lift over head to a pain free range. Hold __1__ seconds. Repeat __5__ times. Do __2__ sessions per day.  Copyright  VHI. All rights reserved.  Press-Up With Wand    Press wand up until elbows are straight, then reach wand over head to a pain free range. Hold __1__ seconds. Repeat __5__ times. Do __2__ sessions per day.  Copyright  VHI. All rights reserved.

## 2016-04-18 ENCOUNTER — Encounter: Payer: Self-pay | Admitting: Physical Therapy

## 2016-04-18 ENCOUNTER — Ambulatory Visit: Payer: PPO | Admitting: Physical Therapy

## 2016-04-18 DIAGNOSIS — M25511 Pain in right shoulder: Secondary | ICD-10-CM | POA: Diagnosis not present

## 2016-04-18 DIAGNOSIS — R293 Abnormal posture: Secondary | ICD-10-CM

## 2016-04-18 NOTE — Therapy (Signed)
[place] [facility] [address] [place], [place], [address] Phone: [phone]   Fax:  [phone]  Physical Therapy Treatment  Patient Details  Name: Veronica FredricksonMarjorie M Hoffmeister MRN: [id number] Date of Birth: 06/28/1941 No Data Recorded  Encounter Date: 04/18/2016      PT End of Session - 04/18/16 1512    Visit Number 9   Number of Visits 25   Date for PT Re-Evaluation 04/18/16   Authorization Type 9/10   PT Start Time 1105   PT Stop Time 1147   PT Time Calculation (min) 42 min   Activity Tolerance Patient tolerated treatment well   Behavior During Therapy [facility] for tasks assessed/performed      History reviewed. No pertinent past medical history.  Past Surgical History  Procedure Laterality Date  . Appendectomy      AGE 75  . Shoulder arthroscopy with open rotator cuff repair and distal clavicle acrominectomy Right 03/09/2016    Procedure: SHOULDER ARTHROSCOPY WITH OPEN ROTATOR CUFF REPAIR AND DISTAL CLAVICLE ACROMINECTOMY;  Surgeon: Juanell FairlyKevin Krasinski, MD;  Location: [facility] ORS;  Service: Orthopedics;  Laterality: Right;    There were no vitals filed for this visit.      Subjective Assessment - 04/18/16 1510    Subjective Patient reports having slight soreness today after wand exercises; states HEP is going well with no issues just slight stretch in shoulder during movements.    Diagnostic tests MRI   Patient Stated Goals Pt reports she would like to raise her arm overhead   Currently in Pain? Yes   Pain Score 1    Pain Location Shoulder   Pain Orientation Right   Pain Descriptors / Indicators Sore   Pain Onset 1 to 4 weeks ago     Treatment:  Patient in reclined position throughout treatment.  SPT performed PROM for 3-4 minutes each direction including: Shoulder flexion, abduction in scaption (patient reports slight pain until reminded to maintain relaxed position), extension, IR, and ER. Throughout PROM patient required  min VCs to relax;  After PROM performed by SPT, measured patient's shoulder PROM  Flexion: 100 Extension: 43 IR: 40 ER: 8, may be due to patient guarding ABD: 65   Submaximal isometrics with patient's arm in scaption and elbow flexed at 90 degrees.  Isometrics were done 10 reps in each position; multiple rests and then 5 reps; contraction held for 2-3 seconds with 2 finger pressure from SPT.  Instructed patient to perform isometrics for shoulder ER, IR, flexion, ABD/ADD, extension, and elbow flexion.  Patient reported mild cramping feeling; slight pulling during shoulder abduction.  Re-educated on AAROM today.  Re-educated on HEP with wand exercises including flexion, abduction, and chest presses; x 10 reps.  Instructed to perform in reclined position with pillow supporting posterior arm throughout motion and to perform shoulder ABD with proper hand placement. Throughout wand exercises, patient reports slight pull in all directions with no pain.  Seated: Ranger (from floor at patients waist height) 2 x 10, shoulder flexion/extension, ABD/ADD.  Ranger alphabet x 1 Clockwise and counter clockwise movements x 30 seconds each direction.                             PT Education - 04/18/16 1512    Education provided Yes   Education Details Re-educated HEP   Person(s) Educated Patient   Methods Explanation;Verbal cues   Comprehension Verbalized understanding;Verbal cues required  PT Long Term Goals - 03/21/16 0945    PT LONG TERM GOAL #1   Title pt will demonstrate at least 140 deg R shoulder PROM to return to funciton   Time 4   Period Weeks   Status New   PT LONG TERM GOAL #2   Title pt will be independent and compliant with HEP   Time 8   Period Weeks   Status New   PT LONG TERM GOAL #3   Title pt will demonstrate 140 deg of R shoulder AROM to return to ADLs   Time 12   Period Weeks   Status New   PT LONG TERM GOAL #4    Title pt will be independent and compliant with progressive strengthening program to return to PLOF   Time 12   Period Weeks   Status New   PT LONG TERM GOAL #5   Title pt will be able to improve shoulder IR to 45 deg to be able to fasten her bra   Time 12   Period Weeks   Status New               Plan - 04/18/16 1513    Clinical Impression Statement Patient still has difficulty relaxing during PROM; able to relax with min VCs, reports pain during PROM when she is not relaxing especially into shoulder ABD. Patient is able to tolerate PROM and submaximal isometrics well with min VCs to decrease contraction with submaximal isometrics. Patient reports shoulder feels like it "cramps" some times after isometrics. Patient required re-instruction to perform wand exercises appropriately and improved positioning to decrease pain.   Rehab Potential Good   Clinical Impairments Affecting Rehab Potential age, complexity of surgery   PT Frequency 2x / week   PT Duration 12 weeks   PT Treatment/Interventions ADLs/Self Care Home Management;Aquatic Therapy;Cryotherapy;Electrical Stimulation;Moist Heat;Ultrasound;Patient/family education;Therapeutic exercise;Therapeutic activities;Functional mobility training;Manual techniques;Dry needling;Passive range of motion;Taping   PT Next Visit Plan AAROM, isometrics   PT Home Exercise Plan Re-instructed in wand exercises   Consulted and Agree with Plan of Care Patient      Patient will benefit from skilled therapeutic intervention in order to improve the following deficits and impairments:  Decreased activity tolerance, Decreased endurance, Decreased range of motion, Decreased safety awareness, Decreased strength, Hypomobility, Impaired UE functional use, Pain, Impaired sensation  Visit Diagnosis: Abnormal posture  Pain in right shoulder     Problem List Patient Active Problem List   Diagnosis Date Noted  . Full thickness rotator cuff tear  03/09/2016   Trula Oreae Quayshaun Hubbert, SPT  This entire session was performed under direct supervision and direction of a licensed therapist/therapist assistant . I have personally read, edited and approve of the note as written.  Trotter,Margaret PT, DPT 04/18/2016, 3:37 PM   [facility] [address] [place], [place], [address] Phone: [phone]   Fax:  [phone]  Name: Veronica FredricksonMarjorie M Abrams MRN: [id number] Date of Birth: 08/01/1941

## 2016-04-20 ENCOUNTER — Ambulatory Visit: Payer: PPO | Admitting: Physical Therapy

## 2016-04-20 ENCOUNTER — Encounter: Payer: Self-pay | Admitting: Physical Therapy

## 2016-04-20 DIAGNOSIS — R293 Abnormal posture: Secondary | ICD-10-CM

## 2016-04-20 DIAGNOSIS — M25511 Pain in right shoulder: Secondary | ICD-10-CM | POA: Diagnosis not present

## 2016-04-20 NOTE — Patient Instructions (Signed)
  Copyright  VHI. All rights reserved.  SHOULDER: Flexion On Table    Place hands on table, elbows straight. Move hips away from body. Press hands down into table. Hold _1-2__ seconds. _10__ reps per set, _2__ sets per day, __5_ days per week   Copyright  VHI. All rights reserved.  External Rotation (Passive)    With elbow bent and forearm on table, palm down, bend forward at waist until a stretch is felt. Hold __2-3__ seconds. Repeat __5__ times. Do __2__ sessions per day.  Copyright  VHI. All rights reserved.  SHOULDER: External Rotation - Sitting (Table Top)    Use one arm to rotate other arm away from body. Keep elbow next to body. _10__ reps per set, _2__ sets per day, _5__ days per week Use towel or pillowcase under arm as needed.  Copyright  VHI. All rights reserved.

## 2016-04-20 NOTE — Therapy (Signed)
[place] MAIN [facility] SERVICES [address] [place], [place], [address] Phone: [phone]   Fax:  [phone]  Physical Therapy Treatment  Patient Details  Name: Veronica Olsen MRN: [id number] Date of Birth: [birth year] No Data Recorded  Encounter Date: 04/20/2016      PT End of Session - 04/20/16 1351    Visit Number 10   Number of Visits 25   Date for PT Re-Evaluation 05/18/16   Authorization Type 10/10   PT Start Time 1259   PT Stop Time 1346   PT Time Calculation (min) 47 min   Activity Tolerance Patient tolerated treatment well   Behavior During Therapy [facility] for tasks assessed/performed      History reviewed. No pertinent past medical history.  Past Surgical History  Procedure Laterality Date  . Appendectomy      AGE 75  . Shoulder arthroscopy with open rotator cuff repair and distal clavicle acrominectomy Right 03/09/2016    Procedure: SHOULDER ARTHROSCOPY WITH OPEN ROTATOR CUFF REPAIR AND DISTAL CLAVICLE ACROMINECTOMY;  Surgeon: Thornton Park, MD;  Location: [facility] ORS;  Service: Orthopedics;  Laterality: Right;    There were no vitals filed for this visit.      Subjective Assessment - 04/20/16 1256    Subjective Patient reports feeling good today and is having less pain with HEP with increased support behind shoulder.   Diagnostic tests MRI   Patient Stated Goals Pt reports she would like to raise her arm overhead   Currently in Pain? No/denies   Pain Onset 1 to 4 weeks ago        Treatment:   Patient in reclined position throughout treatment.  SPT performed PROM for 3-4 minutes each direction including: Shoulder flexion, abduction in scaption, extension, IR, and ER. Patient continues to be limited in ER which may be due to guarding; Throughout PROM patient required min VCs to relax although improved from previous sessions;  Submaximal isometrics with patient's arm in scaption and elbow flexed at 90 degrees.   Isometrics were done 6 reps in each position; multiple rests and then 10 reps; contraction held for 2-3 seconds with 2 finger pressure from SPT.  Instructed patient to perform isometrics for shoulder ER, IR, flexion, ABD/ADD, extension, and elbow flexion.  Patient reported no adverse feelings during motion.  Re-educated on HEP with wand exercises including flexion, abduction, and chest presses; x 10 reps.  Patient requires re-education to perform chest presses towards the ceiling and not towards her toes; also required cueing to perform shoulder flexion appropriately with improved range. Throughout wand exercises, patient reports slight pull in all directions with no pain.  Seated: Ranger (from floor at patients waist height) 2 x 10, shoulder flexion/extension, ABD/ADD.  Ranger random letters x 18 Clockwise and counter clockwise movements x 30 seconds each direction.   Initiated table slides in seated position into shoulder flexion, shoulder ABD/ADD, and counterclockwise/clockwise circles; 2 x 10; RUE only Seated Passive ER with arm supported on table; 2 x 5; instructed to hold for 2-3 seconds at the end range return to prior position. Instructed for improved posture while performing and that feeling a stretch is okay but not to push through pain.  Throughout treatment, patient required min assist to improve form for safety of exercises.                        PT Education - 04/20/16 1350    Education provided Yes  Education Details HEP progression, posture   Person(s) Educated Patient   Methods Explanation;Demonstration;Verbal cues   Comprehension Verbal cues required;Returned demonstration;Verbalized understanding             PT Long Term Goals - 23-Apr-2016 1355    PT LONG TERM GOAL #1   Title pt will demonstrate at least 140 deg R shoulder PROM to return to funciton   Time 4   Period Weeks   Status Partially Met   PT LONG TERM GOAL #2   Title pt  will be independent and compliant with HEP   Time 8   Period Weeks   Status Partially Met   PT LONG TERM GOAL #3   Title pt will demonstrate 140 deg of R shoulder AROM to return to ADLs   Time 12   Period Weeks   Status Deferred   PT LONG TERM GOAL #4   Title pt will be independent and compliant with progressive strengthening program to return to PLOF   Time 12   Period Weeks   Status Deferred   PT LONG TERM GOAL #5   Title pt will be able to improve shoulder IR to 45 deg to be able to fasten her bra   Time 12   Period Weeks   Status Deferred               Plan - 2016/04/23 1351    Clinical Impression Statement Patient is able to perform Ranger and table slides with good motion and min. VCs. to address posture and technique. Patient continues to have difficulty relaxing; continues to require VCs but not as frequently.  Patient had decreased levels of pain throughout treatment and no complaints of cramping feeling after isometrics. Patient needed re-instruction in wand exercises in order to perform with correct technique. Patient would contiue to benefit from skilled PT in order to address pain, ROM, and ability to return to function.   Rehab Potential Good   Clinical Impairments Affecting Rehab Potential age, complexity of surgery   PT Frequency 2x / week   PT Duration 12 weeks   PT Treatment/Interventions ADLs/Self Care Home Management;Aquatic Therapy;Cryotherapy;Electrical Stimulation;Moist Heat;Ultrasound;Patient/family education;Therapeutic exercise;Therapeutic activities;Functional mobility training;Manual techniques;Dry needling;Passive range of motion;Taping   PT Next Visit Plan AAROM, isometrics   PT Home Exercise Plan Progressed HEP, re-educated on HEP   Consulted and Agree with Plan of Care Patient      Patient will benefit from skilled therapeutic intervention in order to improve the following deficits and impairments:  Decreased activity tolerance, Decreased  endurance, Decreased range of motion, Decreased safety awareness, Decreased strength, Hypomobility, Impaired UE functional use, Pain, Impaired sensation  Visit Diagnosis: Abnormal posture  Pain in right shoulder       G-Codes - 23-Apr-2016 1409    Functional Assessment Tool Used ROM, clinical judgement   Functional Limitation Changing and maintaining body position   Changing and Maintaining Body Position Current Status (X9147) At least 60 percent but less than 80 percent impaired, limited or restricted   Changing and Maintaining Body Position Goal Status (W2956) At least 1 percent but less than 20 percent impaired, limited or restricted      Problem List Patient Active Problem List   Diagnosis Date Noted  . Full thickness rotator cuff tear 03/09/2016   Tilman Neat, SPT This entire session was performed under direct supervision and direction of a licensed therapist/therapist assistant . I have personally read, edited and approve of the note as written.  Trotter,Margaret PT,  DPT 04/20/2016, 2:19 PM  [place] MAIN [facility] SERVICES [address] [place], [place], [address] Phone: [phone]   Fax:  [phone]  Name: ELLIONA DODDRIDGE MRN: [id number] Date of Birth: 1941-10-17

## 2016-04-24 ENCOUNTER — Ambulatory Visit: Payer: PPO | Attending: Orthopedic Surgery

## 2016-04-24 DIAGNOSIS — R293 Abnormal posture: Secondary | ICD-10-CM

## 2016-04-24 DIAGNOSIS — M25511 Pain in right shoulder: Secondary | ICD-10-CM

## 2016-04-24 NOTE — Therapy (Signed)
[place] MAIN [facility] SERVICES [address] [place], [place], [address] Phone: [phone]   Fax:  [phone]  Physical Therapy Treatment  Patient Details  Name: Veronica Olsen MRN: [id number] Date of Birth: April 30, 1941 No Data Recorded  Encounter Date: 04/24/2016      PT End of Session - 04/24/16 1623    Visit Number 11   Number of Visits 25   Date for PT Re-Evaluation 05/18/16   Authorization Type 2/10   PT Start Time 1300   PT Stop Time 1346   PT Time Calculation (min) 46 min   Activity Tolerance Patient tolerated treatment well   Behavior During Therapy [facility] for tasks assessed/performed      History reviewed. No pertinent past medical history.  Past Surgical History  Procedure Laterality Date  . Appendectomy      AGE 75  . Shoulder arthroscopy with open rotator cuff repair and distal clavicle acrominectomy Right 03/09/2016    Procedure: SHOULDER ARTHROSCOPY WITH OPEN ROTATOR CUFF REPAIR AND DISTAL CLAVICLE ACROMINECTOMY;  Surgeon: Thornton Park, MD;  Location: [facility] ORS;  Service: Orthopedics;  Laterality: Right;    There were no vitals filed for this visit.      Subjective Assessment - 04/24/16 1615    Subjective pt reports her shoulder is  a liittle sore but not too bad   Diagnostic tests MRI   Patient Stated Goals Pt reports she would like to raise her arm overhead   Currently in Pain? Yes   Pain Score 2    Pain Location Shoulder   Pain Orientation Right   Pain Descriptors / Indicators Aching   Pain Onset 1 to 4 weeks ago        manual therapy: STM efflurage and muscle stripping to R biceps and deltoid x 12 min Scar massage x 3 min PROM in to shoulder flexion , abduction, ER/IR with gentle over pressure each way x 10 reps each Pt needs mod cues to relax. PT used some contract/relax techniques to mediate this   Therex: UE ranger ABCs at waist level (on ground) UE ranger into end range flexion / scaption  AAROM x 10 each Bent over shoulder extension and rows 2 x10- each with tactile cues to retract scapula  Shoulder AAROM with PT assist into flexion to 90 deg 2 x 5 Static hold at 90 deg shoulder flexion in supine after PT assist into position 10s x 5 Pt requires mod verbal and tactile cues for proper exercise performance   Especially to reduce shoulder shrug   CP to R shoulder following session x 10 min no charge                          PT Education - 04/24/16 1623    Education provided Yes   Education Details scar massage   Person(s) Educated Patient   Methods Explanation   Comprehension Verbalized understanding             PT Long Term Goals - 04/20/16 1355    PT LONG TERM GOAL #1   Title pt will demonstrate at least 140 deg R shoulder PROM to return to funciton   Time 4   Period Weeks   Status Partially Met   PT LONG TERM GOAL #2   Title pt will be independent and compliant with HEP   Time 8   Period Weeks   Status Partially Met   PT LONG  TERM GOAL #3   Title pt will demonstrate 140 deg of R shoulder AROM to return to ADLs   Time 12   Period Weeks   Status Deferred   PT LONG TERM GOAL #4   Title pt will be independent and compliant with progressive strengthening program to return to PLOF   Time 12   Period Weeks   Status Deferred   PT LONG TERM GOAL #5   Title pt will be able to improve shoulder IR to 45 deg to be able to fasten her bra   Time 12   Period Weeks   Status Deferred               Plan - 04/24/16 1623    Clinical Impression Statement upon palpation pt has extensive soft tissue restrictions of the R upper arm and some scar adhesions. pt was instructed on self scar massage. she responded well to [facility] with improved ROM following, but was painful during STM. progressed periscapular strengthening today without issue. she still struggles with guarding during PROM and shoulder shrug during AAROM activities.    Rehab Potential  Good   Clinical Impairments Affecting Rehab Potential age, complexity of surgery   PT Frequency 2x / week   PT Duration 12 weeks   PT Treatment/Interventions ADLs/Self Care Home Management;Aquatic Therapy;Cryotherapy;Electrical Stimulation;Moist Heat;Ultrasound;Patient/family education;Therapeutic exercise;Therapeutic activities;Functional mobility training;Manual techniques;Dry needling;Passive range of motion;Taping   PT Next Visit Plan AAROM, isometrics   PT Home Exercise Plan Progressed HEP, re-educated on HEP   Consulted and Agree with Plan of Care Patient      Patient will benefit from skilled therapeutic intervention in order to improve the following deficits and impairments:  Decreased activity tolerance, Decreased endurance, Decreased range of motion, Decreased safety awareness, Decreased strength, Hypomobility, Impaired UE functional use, Pain, Impaired sensation  Visit Diagnosis: Abnormal posture  Pain in right shoulder     Problem List Patient Active Problem List   Diagnosis Date Noted  . Full thickness rotator cuff tear 03/09/2016   Gorden Harms. Jazlene Bares, PT, DPT [phone]  Admir Candelas 04/24/2016, 4:27 PM  [place] MAIN [facility] SERVICES [address] [place], [place], [address] Phone: [phone]   Fax:  [phone]  Name: Veronica Olsen MRN: [id number] Date of Birth: [birth year]

## 2016-04-26 ENCOUNTER — Ambulatory Visit: Payer: PPO | Admitting: Physical Therapy

## 2016-04-26 ENCOUNTER — Encounter: Payer: Self-pay | Admitting: Physical Therapy

## 2016-04-26 DIAGNOSIS — R293 Abnormal posture: Secondary | ICD-10-CM | POA: Diagnosis not present

## 2016-04-26 DIAGNOSIS — M25511 Pain in right shoulder: Secondary | ICD-10-CM

## 2016-04-26 NOTE — Therapy (Signed)
[place] MAIN [facility] SERVICES [address] [place], [place], [address] Phone: [phone]   Fax:  [phone]  Physical Therapy Treatment  Patient Details  Name: Veronica Olsen MRN: [id number] Date of Birth: 1941-04-18 No Data Recorded  Encounter Date: 04/26/2016      PT End of Session - 04/26/16 1444    Visit Number 12   Number of Visits 25   Date for PT Re-Evaluation 05/18/16   Authorization Type 2/10   PT Start Time 0230   PT Stop Time 0310   PT Time Calculation (min) 40 min   Activity Tolerance Patient tolerated treatment well   Behavior During Therapy [facility] for tasks assessed/performed      History reviewed. No pertinent past medical history.  Past Surgical History  Procedure Laterality Date  . Appendectomy      AGE 75  . Shoulder arthroscopy with open rotator cuff repair and distal clavicle acrominectomy Right 03/09/2016    Procedure: SHOULDER ARTHROSCOPY WITH OPEN ROTATOR CUFF REPAIR AND DISTAL CLAVICLE ACROMINECTOMY;  Surgeon: Thornton Park, MD;  Location: [facility] ORS;  Service: Orthopedics;  Laterality: Right;    There were no vitals filed for this visit.      Subjective Assessment - 04/26/16 1444    Subjective pt reports her shoulder is  a liittle sore but not too bad   Diagnostic tests MRI   Patient Stated Goals Pt reports she would like to raise her arm overhead   Pain Onset 1 to 4 weeks ago     Therapeutic exercise; AAROM to right UE in sitting x 10 Shoulder ranger CCW, CW, flex/ext, horizontal abd/add x 10 Seated at raised table top with pillow case horizontal abd/add, shoulder protraction and reaching fwd x 20  theraball on table for supported RUE exercises fwd protraction/retraction, horizontal abd/add Supine abd with pillowcase to allow easier motion x 10 x 2, AAROM with cane bench press and horizontal abd/add x 10 x 2 AAROM in supine with cane for shoulder flex x 10 x 2 Pulley over door in seated x 5  minutes  Patient has no reports of increased shoulder pain following therapy                            PT Education - 04/26/16 1444    Education provided Yes   Education Details HEP   Person(s) Educated Patient   Methods Explanation   Comprehension Verbalized understanding             PT Long Term Goals - 04/20/16 1355    PT LONG TERM GOAL #1   Title pt will demonstrate at least 140 deg R shoulder PROM to return to funciton   Time 4   Period Weeks   Status Partially Met   PT LONG TERM GOAL #2   Title pt will be independent and compliant with HEP   Time 8   Period Weeks   Status Partially Met   PT LONG TERM GOAL #3   Title pt will demonstrate 140 deg of R shoulder AROM to return to ADLs   Time 12   Period Weeks   Status Deferred   PT LONG TERM GOAL #4   Title pt will be independent and compliant with progressive strengthening program to return to PLOF   Time 12   Period Weeks   Status Deferred   PT LONG TERM GOAL #5   Title pt will be  able to improve shoulder IR to 45 deg to be able to fasten her bra   Time 12   Period Weeks   Status Deferred               Plan - 04/26/16 1445    Clinical Impression Statement Patient was able to perform AAROM with table top exercises , pendulum exercises and AAROM to right shoulder with minimal increased pain to right shoulder.   Rehab Potential Good   Clinical Impairments Affecting Rehab Potential age, complexity of surgery   PT Frequency 2x / week   PT Duration 12 weeks   PT Treatment/Interventions ADLs/Self Care Home Management;Aquatic Therapy;Cryotherapy;Electrical Stimulation;Moist Heat;Ultrasound;Patient/family education;Therapeutic exercise;Therapeutic activities;Functional mobility training;Manual techniques;Dry needling;Passive range of motion;Taping   PT Next Visit Plan AAROM, isometrics   PT Home Exercise Plan Progressed HEP, re-educated on HEP   Consulted and Agree with Plan of Care  Patient      Patient will benefit from skilled therapeutic intervention in order to improve the following deficits and impairments:  Decreased activity tolerance, Decreased endurance, Decreased range of motion, Decreased safety awareness, Decreased strength, Hypomobility, Impaired UE functional use, Pain, Impaired sensation  Visit Diagnosis: Pain in right shoulder     Problem List Patient Active Problem List   Diagnosis Date Noted  . Full thickness rotator cuff tear 03/09/2016  Alanson Puls, PT, DPT  [place], [place] S 04/26/2016, 2:48 PM  [place] MAIN [facility] SERVICES [address] [place], [place], [address] Phone: [phone]   Fax:  [phone]  Name: Veronica Olsen MRN: [id number] Date of Birth: [birth year]

## 2016-05-01 ENCOUNTER — Ambulatory Visit: Payer: PPO

## 2016-05-01 DIAGNOSIS — M25511 Pain in right shoulder: Secondary | ICD-10-CM

## 2016-05-01 DIAGNOSIS — R293 Abnormal posture: Secondary | ICD-10-CM | POA: Diagnosis not present

## 2016-05-01 NOTE — Therapy (Signed)
[facility] MAIN [facility] SERVICES [address] [place], [place], [address] Phone: [phone]   Fax:  [phone]  Physical Therapy Treatment  Patient Details  Name: Veronica Olsen MRN: [id number] Date of Birth: Jun 17, 1941 No Data Recorded  Encounter Date: 05/01/2016      PT End of Session - 05/01/16 1131    Visit Number 13   Number of Visits 25   Date for PT Re-Evaluation 05/18/16   Authorization Type 3/10   PT Start Time 1125   PT Stop Time 1210   PT Time Calculation (min) 45 min   Activity Tolerance Patient tolerated treatment well   Behavior During Therapy [facility] for tasks assessed/performed      History reviewed. No pertinent past medical history.  Past Surgical History  Procedure Laterality Date  . Appendectomy      AGE 36  . Shoulder arthroscopy with open rotator cuff repair and distal clavicle acrominectomy Right 03/09/2016    Procedure: SHOULDER ARTHROSCOPY WITH OPEN ROTATOR CUFF REPAIR AND DISTAL CLAVICLE ACROMINECTOMY;  Surgeon: Juanell Fairly, MD;  Location: [facility] ORS;  Service: Orthopedics;  Laterality: Right;    There were no vitals filed for this visit.      Subjective Assessment - 05/01/16 1129    Subjective pt states her shoulder feels good and that she is still performing her HEP.   Diagnostic tests MRI   Patient Stated Goals Pt reports she would like to raise her arm overhead   Currently in Pain? No/denies   Pain Onset 1 to 4 weeks ago      THEREX UE ranger on floor (waist level), shoulder flexion, horizontal abd/add, x 30 each; CCW, CC x 10 each; ABCs; min cues to decrease shoulder elevation throughout ball rolls on table with slight incline into shoulder flexion and scaption, 2x10 each, min verbal/tactile cues to decrease UT compensation bent over shoulder extension and rows, 2x10 each; min verbal/tactile cues for technique  MANUAL Mod efflurage to deltoid and biceps; cross friction and efflurage for scar  mobilization x 12 min PROM shoulder flexion, abd, IR/ER, x 10 each; min-mod verbal/tactile cues for relaxation AAROM with HHA into shoulder flexion and abd x 10 each; min verbal cues to decrease UT compensation        PT Education - 05/01/16 1129    Education provided Yes   Education Details exercise technique   Person(s) Educated Patient   Methods Explanation   Comprehension Verbalized understanding             PT Long Term Goals - 04/20/16 1355    PT LONG TERM GOAL #1   Title pt will demonstrate at least 140 deg R shoulder PROM to return to funciton   Time 4   Period Weeks   Status Partially Met   PT LONG TERM GOAL #2   Title pt will be independent and compliant with HEP   Time 8   Period Weeks   Status Partially Met   PT LONG TERM GOAL #3   Title pt will demonstrate 140 deg of R shoulder AROM to return to ADLs   Time 12   Period Weeks   Status Deferred   PT LONG TERM GOAL #4   Title pt will be independent and compliant with progressive strengthening program to return to PLOF   Time 12   Period Weeks   Status Deferred   PT LONG TERM GOAL #5   Title pt will be able to improve shoulder  IR to 45 deg to be able to fasten her bra   Time 12   Period Weeks   Status Deferred               Plan - 05/01/16 1312    Clinical Impression Statement pt able to participate in AAROM, and gravity eliminated AROM today with no increase in shoulder pain. pt continues with soft tissue restrictions of R deltoid, biceps, and scars. pt educated to continue soft tissue techniques at home to help decrease pain with movement. pt needs cues to decrease UT compensation and to relax during PROM.    Rehab Potential Good   Clinical Impairments Affecting Rehab Potential age, complexity of surgery   PT Frequency 2x / week   PT Duration 12 weeks   PT Treatment/Interventions ADLs/Self Care Home Management;Aquatic Therapy;Cryotherapy;Electrical Stimulation;Moist  Heat;Ultrasound;Patient/family education;Therapeutic exercise;Therapeutic activities;Functional mobility training;Manual techniques;Dry needling;Passive range of motion;Taping   PT Next Visit Plan AAROM, isometrics   PT Home Exercise Plan Progressed HEP, re-educated on HEP   Consulted and Agree with Plan of Care Patient      Patient will benefit from skilled therapeutic intervention in order to improve the following deficits and impairments:  Decreased activity tolerance, Decreased endurance, Decreased range of motion, Decreased safety awareness, Decreased strength, Hypomobility, Impaired UE functional use, Pain, Impaired sensation  Visit Diagnosis: Pain in right shoulder  Abnormal posture     Problem List Patient Active Problem List   Diagnosis Date Noted  . Full thickness rotator cuff tear 03/09/2016   [name], SPT This entire session was performed under direct supervision and direction of a licensed therapist/therapist assistant . I have personally read, edited and approve of the note as written. Gorden Harms. Tortorici, PT, DPT [phone]  Tortorici,Ashley 05/01/2016, 4:31 PM  [place] MAIN [facility] SERVICES [address] [place], [place], [address] Phone: [phone]   Fax:  [phone]  Name: Veronica Olsen MRN: [id number] Date of Birth: 1940-11-20

## 2016-05-03 ENCOUNTER — Ambulatory Visit: Payer: PPO | Admitting: Physical Therapy

## 2016-05-03 DIAGNOSIS — R293 Abnormal posture: Secondary | ICD-10-CM | POA: Diagnosis not present

## 2016-05-03 DIAGNOSIS — M25511 Pain in right shoulder: Secondary | ICD-10-CM

## 2016-05-03 NOTE — Therapy (Signed)
[place] MAIN [facility] SERVICES [address] [place], [place], [address] Phone: [phone]   Fax:  [phone]  Physical Therapy Treatment  Patient Details  Name: Veronica Olsen MRN: [id number] Date of Birth: 08/11/1941 No Data Recorded  Encounter Date: 05/03/2016      PT End of Session - 05/03/16 1124    Visit Number 14   Number of Visits 25   Date for PT Re-Evaluation 05/18/16   Authorization Type 4/10   PT Start Time 1116   Activity Tolerance Patient tolerated treatment well   Behavior During Therapy [facility] for tasks assessed/performed      History reviewed. No pertinent past medical history.  Past Surgical History  Procedure Laterality Date  . Appendectomy      AGE 75  . Shoulder arthroscopy with open rotator cuff repair and distal clavicle acrominectomy Right 03/09/2016    Procedure: SHOULDER ARTHROSCOPY WITH OPEN ROTATOR CUFF REPAIR AND DISTAL CLAVICLE ACROMINECTOMY;  Surgeon: Thornton Park, MD;  Location: [facility] ORS;  Service: Orthopedics;  Laterality: Right;    There were no vitals filed for this visit.      Subjective Assessment - 05/03/16 1121    Subjective pt reports her shoulder was a little sore after last treatment session and states her arm was a little red following treatment session due to manual therapy techniques performed.   Diagnostic tests MRI   Patient Stated Goals Pt reports she would like to raise her arm overhead   Currently in Pain? Yes   Pain Score 2    Pain Location Shoulder   Pain Orientation Right   Pain Descriptors / Indicators Sore   Pain Onset 1 to 4 weeks ago       [place] with UE Ranger (on floor, waist level), ABCs; CW, CCW, horizontal abd/add x 30 RUE AAROM in modified supine with cane, shoulder flexion 2x10, abd into scaption 1x10 Bent over RUE rows, shoulder extension, shoulder abd (elbow bent), 2x10 each; mod tactile cues for proper technique rhythmic stabs at 45, 60, 90  (x2) deg shoulder flexion, proximal and distal pushing sidelying ER, 2x10 sidelying shoulder flexion with UE ranger, 2x15 Pt required mod verbal and tactile cues throughout all exercises for decreased UT compensation.   MANUAL PROM shoulder flexion, abd, ER, IR, x 5 each direction; max verbal cues for relaxation and decreased muscle guarding          PT Education - 05/03/16 1123    Education provided Yes   Education Details exercise, decrease UT compensation throughout   [employer]) Educated Patient   Methods Explanation   Comprehension Verbalized understanding             PT Long Term Goals - 04/20/16 1355    PT LONG TERM GOAL #1   Title pt will demonstrate at least 140 deg R shoulder PROM to return to funciton   Time 4   Period Weeks   Status Partially Met   PT LONG TERM GOAL #2   Title pt will be independent and compliant with HEP   Time 8   Period Weeks   Status Partially Met   PT LONG TERM GOAL #3   Title pt will demonstrate 140 deg of R shoulder AROM to return to ADLs   Time 12   Period Weeks   Status Deferred   PT LONG TERM GOAL #4   Title pt will be independent and compliant with progressive strengthening program to return to [facility]  Time 12   Period Weeks   Status Deferred   PT LONG TERM GOAL #5   Title pt will be able to improve shoulder IR to 45 deg to be able to fasten her bra   Time 12   Period Weeks   Status Deferred               Plan - 05/03/16 1203    Clinical Impression Statement pt continues to have restrictions in AAROM and PROM due to increased muscle guarding. She needs verbal and tactile cues to decrease UT compensation throughout all exercises. Shoulder abd and ER PROM are the most limited and painful for pt. pt will be 8 weeks post-op next week and per protocol can begin AROM and "light" strengthening program. pt needs continued skilled PT intervention to maximize overall function.   Rehab Potential Good   Clinical Impairments  Affecting Rehab Potential age, complexity of surgery   PT Frequency 2x / week   PT Duration 12 weeks   PT Treatment/Interventions ADLs/Self Care Home Management;Aquatic Therapy;Cryotherapy;Electrical Stimulation;Moist Heat;Ultrasound;Patient/family education;Therapeutic exercise;Therapeutic activities;Functional mobility training;Manual techniques;Dry needling;Passive range of motion;Taping   PT Next Visit Plan AROM, strengthening   PT Home Exercise Plan Progressed HEP, re-educated on HEP   Consulted and Agree with Plan of Care Patient      Patient will benefit from skilled therapeutic intervention in order to improve the following deficits and impairments:  Decreased activity tolerance, Decreased endurance, Decreased range of motion, Decreased safety awareness, Decreased strength, Hypomobility, Impaired UE functional use, Pain, Impaired sensation  Visit Diagnosis: Pain in right shoulder  Abnormal posture     Problem List Patient Active Problem List   Diagnosis Date Noted  . Full thickness rotator cuff tear 03/09/2016   [name], SPT   [name] 05/03/2016, 12:07 PM   This entire session was performed under direct supervision and direction of a licensed therapist/therapist assistant . I have personally read, edited and approve of the note as written.  Mindy Shiela Mayer, PT, DPT  05/03/2016, 1:31 PM [place] MAIN [facility] SERVICES [address] [place], [place], [address] Phone: [phone]   Fax:  [phone]  Name: Veronica Olsen MRN: [id number] Date of Birth: 10-16-1941

## 2016-05-08 ENCOUNTER — Ambulatory Visit: Payer: PPO | Admitting: Physical Therapy

## 2016-05-08 DIAGNOSIS — M25511 Pain in right shoulder: Secondary | ICD-10-CM

## 2016-05-08 DIAGNOSIS — R293 Abnormal posture: Secondary | ICD-10-CM

## 2016-05-08 NOTE — Therapy (Signed)
[facility] SERVICES [address] [place], [place], [address] Phone: [phone]   Fax:  [phone]  Physical Therapy Treatment  Patient Details  Name: Veronica Olsen MRN: [id number] Date of Birth: 09-04-1941 No Data Recorded  Encounter Date: 05/08/2016      PT End of Session - 05/08/16 1132    Visit Number 15   Number of Visits 25   Date for PT Re-Evaluation 05/18/16   Authorization Type 5/10   PT Start Time 1118   PT Stop Time 1200   PT Time Calculation (min) 42 min   Activity Tolerance Patient tolerated treatment well   Behavior During Therapy [facility] for tasks assessed/performed      History reviewed. No pertinent past medical history.  Past Surgical History  Procedure Laterality Date  . Appendectomy      AGE 75  . Shoulder arthroscopy with open rotator cuff repair and distal clavicle acrominectomy Right 03/09/2016    Procedure: SHOULDER ARTHROSCOPY WITH OPEN ROTATOR CUFF REPAIR AND DISTAL CLAVICLE ACROMINECTOMY;  Surgeon: Thornton Park, MD;  Location: [facility] ORS;  Service: Orthopedics;  Laterality: Right;    There were no vitals filed for this visit.      Subjective Assessment - 05/08/16 1121    Subjective pt states her shoulder feels a little better since last treatment session.   Diagnostic tests MRI   Patient Stated Goals Pt reports she would like to raise her arm overhead   Currently in Pain? No/denies   Pain Onset 1 to 4 weeks ago      Therex:  UE ranger on floor, slightly above shoulder level, flexion, horizontal abd/add x30 each; ABCs R shoulder AROM in flexion, abducation, scaption while seated, 2x15 ER/IR with YTB. 3x10 each R biceps curl with 1# DB, x10 bent over R shoulder extension and rows with 1#DB, 2x10 each  Pt required min - mod verbal and tactile cues throughout exercises for proper technique and decreased UT compensation.       PT Education - 05/08/16 1122    Education provided Yes    Education Details AROM, exercise, decrease UT compensation   Person(s) Educated Patient   Methods Explanation   Comprehension Verbalized understanding             PT Long Term Goals - 04/20/16 1355    PT LONG TERM GOAL #1   Title pt will demonstrate at least 140 deg R shoulder PROM to return to funciton   Time 4   Period Weeks   Status Partially Met   PT LONG TERM GOAL #2   Title pt will be independent and compliant with HEP   Time 8   Period Weeks   Status Partially Met   PT LONG TERM GOAL #3   Title pt will demonstrate 140 deg of R shoulder AROM to return to ADLs   Time 12   Period Weeks   Status Deferred   PT LONG TERM GOAL #4   Title pt will be independent and compliant with progressive strengthening program to return to PLOF   Time 12   Period Weeks   Status Deferred   PT LONG TERM GOAL #5   Title pt will be able to improve shoulder IR to 45 deg to be able to fasten her bra   Time 12   Period Weeks   Status Deferred               Plan - 05/08/16 1158  Clinical Impression Statement pt progressed to AROM and light strenghtening exercises today per protocol since she is 8weeks post-op this week. pt tolerated progressed treatment well with no increase in shoulder pain but did c/o muscle fatigue during and following treatment session. pt only able to actively raise shoulder in flexion, scaption, and abduction to shoulder level with minimal UT compensation; pt still required verbal and tactile cues throughout AROM thought for decreased shrug throughout elevation. pt needs continued skilled PT intervention to address remaining deficits to maximize overall independence and function.   Rehab Potential Good   Clinical Impairments Affecting Rehab Potential age, complexity of surgery   PT Frequency 2x / week   PT Duration 12 weeks   PT Treatment/Interventions ADLs/Self Care Home Management;Aquatic Therapy;Cryotherapy;Electrical Stimulation;Moist  Heat;Ultrasound;Patient/family education;Therapeutic exercise;Therapeutic activities;Functional mobility training;Manual techniques;Dry needling;Passive range of motion;Taping   PT Next Visit Plan AROM, strengthening   PT Home Exercise Plan Progressed HEP, re-educated on HEP   Consulted and Agree with Plan of Care Patient      Patient will benefit from skilled therapeutic intervention in order to improve the following deficits and impairments:  Decreased activity tolerance, Decreased endurance, Decreased range of motion, Decreased safety awareness, Decreased strength, Hypomobility, Impaired UE functional use, Pain, Impaired sensation  Visit Diagnosis: Pain in right shoulder  Abnormal posture     Problem List Patient Active Problem List   Diagnosis Date Noted  . Full thickness rotator cuff tear 03/09/2016   [name], SPT  [name] 05/08/2016, 1:00 PM   This entire session was performed under direct supervision and direction of a licensed therapist/therapist assistant . I have personally read, edited and approve of the note as written.  Mindy Shiela Mayer, PT, DPT  05/08/2016, 1:25 PM [place] [facility] [address] [place], [place], [address] Phone: [phone]   Fax:  [phone]  Name: Veronica Olsen MRN: [id number] Date of Birth: 1941-03-12

## 2016-05-08 NOTE — Patient Instructions (Signed)
[email or web address] Seated shoulder flexion, scaption, abd AROM, 2x15 each, 1x/day Standing ER/IR with YTB 3x10 each, 1x/day; perform ER every other day and work up to performing 1x/day

## 2016-05-10 ENCOUNTER — Ambulatory Visit: Payer: PPO | Admitting: Physical Therapy

## 2016-05-10 DIAGNOSIS — M25511 Pain in right shoulder: Secondary | ICD-10-CM

## 2016-05-10 DIAGNOSIS — R293 Abnormal posture: Secondary | ICD-10-CM

## 2016-05-10 NOTE — Therapy (Signed)
[place] [facility] SERVICES [address] [place], [place], [address] Phone: [phone]   Fax:  [phone]  Physical Therapy Treatment  Patient Details  Name: Veronica Olsen MRN: [id number] Date of Birth: [birth year] No Data Recorded  Encounter Date: 05/10/2016      PT End of Session - 05/10/16 1205    Visit Number 16   Number of Visits 25   Date for PT Re-Evaluation 05/18/16   Authorization Type 6/10   PT Start Time 1118   PT Stop Time 1200   PT Time Calculation (min) 42 min   Activity Tolerance Patient tolerated treatment well   Behavior During Therapy [facility] for tasks assessed/performed      History reviewed. No pertinent past medical history.  Past Surgical History  Procedure Laterality Date  . Appendectomy      AGE 75  . Shoulder arthroscopy with open rotator cuff repair and distal clavicle acrominectomy Right 03/09/2016    Procedure: SHOULDER ARTHROSCOPY WITH OPEN ROTATOR CUFF REPAIR AND DISTAL CLAVICLE ACROMINECTOMY;  Surgeon: Thornton Park, MD;  Location: [facility] OR[facility];  Service: Orthopedics;  Laterality: Right;    There were no vitals filed for this visit.      Subjective Assessment - 05/10/16 1157    Subjective pt reports that she was not sore after AROM was initiated last session.   Diagnostic tests MRI   Patient Stated Goals Pt reports she would like to raise her arm overhead   Currently in Pain? No/denies   Pain Onset 1 to 4 weeks ago            PT Education - 05/10/16 1157    Education provided Yes   Education Details decrease UT compensation, AAROM with cane   Person(s) Educated Patient   Methods Explanation   Comprehension Verbalized understanding      Manual: Cross friction and efflurage to R biceps, anterior and middle delt PROM R shoulder flexion x 5 reps; SPT only able to get pt to about 110 deg flexion due to pt tolerance  Therex: R active shoulder flexion, scaption, abduction, 2x15 each;  mod verbal and tactile cues to decrease UT compensation throughout all; pt responded well to tactile feedback on deltoid muscle throughout reps Bent over rows and shoulder extension with 1# DB; 2x15 each; min tactile cues for proper technique IR/ER with YTB, 3x10 each; min verbal and tactile cues for proper technique Sidelying R ER with 1#DB, 3x10; min verbal and tactile cues for proper technique       PT Long Term Goals - 04/20/16 1355    PT LONG TERM GOAL #1   Title pt will demonstrate at least 140 deg R shoulder PROM to return to funciton   Time 4   Period Weeks   Status Partially Met   PT LONG TERM GOAL #2   Title pt will be independent and compliant with HEP   Time 8   Period Weeks   Status Partially Met   PT LONG TERM GOAL #3   Title pt will demonstrate 140 deg of R shoulder AROM to return to ADLs   Time 12   Period Weeks   Status Deferred   PT LONG TERM GOAL #4   Title pt will be independent and compliant with progressive strengthening program to return to PLOF   Time 12   Period Weeks   Status Deferred   PT LONG TERM GOAL #5   Title pt will be able to improve  shoulder IR to 45 deg to be able to fasten her bra   Time 12   Period Weeks   Status Deferred               Plan - 05/10/16 1205    Clinical Impression Statement pt continues to have increased soft tissue restrictions of R deltoid and biceps, which was addressed with manual techniques prior to beginning therex. Pt tolerated well and stated it felt a little looser following manual. Pt continues to demonstrate increased UT compensation throughout all RUE elevation activities and needs verbal and tactile cues to correct. Pt demonstrated fatigue towards end of session. Pt educated on performing AAROM with cane at home to improve A/PROM. Pt needs continued skilled PT intervention to address remaining deficits in order to maximize overall function.   Rehab Potential Good   Clinical Impairments Affecting Rehab  Potential age, complexity of surgery   PT Frequency 2x / week   PT Duration 12 weeks   PT Treatment/Interventions ADLs/Self Care Home Management;Aquatic Therapy;Cryotherapy;Electrical Stimulation;Moist Heat;Ultrasound;Patient/family education;Therapeutic exercise;Therapeutic activities;Functional mobility training;Manual techniques;Dry needling;Passive range of motion;Taping   PT Next Visit Plan AROM, strengthening   PT Home Exercise Plan Progressed HEP, re-educated on HEP   Consulted and Agree with Plan of Care Patient      Patient will benefit from skilled therapeutic intervention in order to improve the following deficits and impairments:  Decreased activity tolerance, Decreased endurance, Decreased range of motion, Decreased safety awareness, Decreased strength, Hypomobility, Impaired UE functional use, Pain, Impaired sensation  Visit Diagnosis: Pain in right shoulder  Abnormal posture     Problem List Patient Active Problem List   Diagnosis Date Noted  . Full thickness rotator cuff tear 03/09/2016   [name], SPT   [name] 05/10/2016, 12:09 PM   This entire session was performed under direct supervision and direction of a licensed therapist/therapist assistant . I have personally read, edited and approve of the note as written.  Mindy Shiela Mayer, PT, DPT  05/10/2016, 12:29 PM [place] [facility] SERVICES [address] [place], [place], [address] Phone: [phone]   Fax:  [phone]  Name: Veronica Olsen MRN: [id number] Date of Birth: 05-18-1941

## 2016-05-15 ENCOUNTER — Ambulatory Visit: Payer: PPO | Admitting: Physical Therapy

## 2016-05-15 DIAGNOSIS — M25511 Pain in right shoulder: Secondary | ICD-10-CM

## 2016-05-15 DIAGNOSIS — R293 Abnormal posture: Secondary | ICD-10-CM | POA: Diagnosis not present

## 2016-05-15 NOTE — Therapy (Addendum)
[place] [facility] SERVICES [address] [place], [place], [address] Phone: [phone]   Fax:  [phone]  Physical Therapy Treatment  Patient Details  Name: Veronica Olsen MRN: [id number] Date of Birth: 1941-07-16 No Data Recorded  Encounter Date: 05/15/2016      PT End of Session - 05/15/16 1148    Visit Number 17   Number of Visits 25   Date for PT Re-Evaluation 05/18/16   Authorization Type 7/10   PT Start Time 1115   PT Stop Time 1157   PT Time Calculation (min) 42 min   Activity Tolerance Patient tolerated treatment well   Behavior During Therapy [facility] for tasks assessed/performed      History reviewed. No pertinent past medical history.  Past Surgical History:  Procedure Laterality Date  . APPENDECTOMY     AGE 31  . SHOULDER ARTHROSCOPY WITH OPEN ROTATOR CUFF REPAIR AND DISTAL CLAVICLE ACROMINECTOMY Right 03/09/2016   Procedure: SHOULDER ARTHROSCOPY WITH OPEN ROTATOR CUFF REPAIR AND DISTAL CLAVICLE ACROMINECTOMY;  Surgeon: Thornton Park, MD;  Location: [facility];  Service: Orthopedics;  Laterality: Right;    There were no vitals filed for this visit.      Subjective Assessment - 05/15/16 1145    Subjective pt reports performing AAROM at home over the weekend and states that she is feeling good today.   Diagnostic tests MRI   Patient Stated Goals Pt reports she would like to raise her arm overhead   Currently in Pain? No/denies   Pain Onset 1 to 4 weeks ago       Manual: PROM shoulder flexion, abduction, ER/IR x 5 each direction x 10 min; min verbal/tactile cues to decrease UT compensation; pt reported feeling slightly looser following Cross friction and muscle stripping to R posterior delt/long head triceps; pt reported improved pain and tightness  Therex: Sidelying ER 1# DB, 3x10 with towel roll under elbow, min verbal cues for proper technique Bent over scap retraction, shoulder extension, shoulder  abduction with 2.5# cuff weight, 3x10 each; mod tactile cues for decreased UT compensation with abduction Standing ER/IR with RTB, 3x10 each; min verbal cues to decrease trunk rotation with ER R active shoulder flexion, scaption, abduction 2x10 each; min verbal cues to decrease UT compensation R active shoulder flexion, scaption, abduction with rhythmic stabs 2x10 each; pt demonstrated fatigue towards end and required min cues to not let SPT move arm         PT Education - 05/15/16 1147    Education provided Yes   Education Details decreased UT compensation, relaxation during PROM   Person(s) Educated Patient   Methods Explanation   Comprehension Verbalized understanding             PT Long Term Goals - 04/20/16 1355      PT LONG TERM GOAL #1   Title pt will demonstrate at least 140 deg R shoulder PROM to return to funciton   Time 4   Period Weeks   Status Partially Met     PT LONG TERM GOAL #2   Title pt will be independent and compliant with HEP   Time 8   Period Weeks   Status Partially Met     PT LONG TERM GOAL #3   Title pt will demonstrate 140 deg of R shoulder AROM to return to ADLs   Time 12   Period Weeks   Status Deferred     PT LONG TERM GOAL #4  Title pt will be independent and compliant with progressive strengthening program to return to PLOF   Time 12   Period Weeks   Status Deferred     PT LONG TERM GOAL #5   Title pt will be able to improve shoulder IR to 45 deg to be able to fasten her bra   Time 12   Period Weeks   Status Deferred               Plan - 05/15/16 1159    Clinical Impression Statement Pt is making progress towards goals, as she was able to tolerate progressed strengthening exercises this date. She had new c/o pain and tenderness to posterior shoulder and upon assessment, pt had soft tissue restrictions of posterior delt/lateral head of triceps which was addressed with manual techniques. Pt reported decreased pain and  restrictions following. Pt continues to have difficulty minimizing UT compensation throughout all activities and needs cues for correction. Pt reported that her shoulder felt "tired" following treatment today. She needs continued skilled PT intervention to maximize overall function and increase ROM.   Rehab Potential Good   Clinical Impairments Affecting Rehab Potential age, complexity of surgery   PT Frequency 2x / week   PT Duration 12 weeks   PT Treatment/Interventions ADLs/Self Care Home Management;Aquatic Therapy;Cryotherapy;Electrical Stimulation;Moist Heat;Ultrasound;Patient/family education;Therapeutic exercise;Therapeutic activities;Functional mobility training;Manual techniques;Dry needling;Passive range of motion;Taping   PT Next Visit Plan Due for Recert - repeat functional outcome measures, AROM, strengthening   PT Home Exercise Plan Progressed HEP, re-educated on HEP   Consulted and Agree with Plan of Care Patient      Patient will benefit from skilled therapeutic intervention in order to improve the following deficits and impairments:  Decreased activity tolerance, Decreased endurance, Decreased range of motion, Decreased safety awareness, Decreased strength, Hypomobility, Impaired UE functional use, Pain, Impaired sensation  Visit Diagnosis: Pain in right shoulder  Abnormal posture     Problem List Patient Active Problem List   Diagnosis Date Noted  . Full thickness rotator cuff tear 03/09/2016   [name], SPT This entire session was performed under direct supervision and direction of a licensed therapist/therapist assistant . I have personally read, edited and approve of the note as written.  Brice,Carrie PT, DPT 05/15/2016, 2:43 PM  [place] [facility] SERVICES [address] [place], [place], [address] Phone: [phone]   Fax:  [phone]  Name: Veronica Olsen MRN: [id number] Date of Birth: 1941/07/13

## 2016-05-17 ENCOUNTER — Ambulatory Visit: Payer: PPO

## 2016-05-17 DIAGNOSIS — M25511 Pain in right shoulder: Secondary | ICD-10-CM

## 2016-05-17 DIAGNOSIS — R293 Abnormal posture: Secondary | ICD-10-CM

## 2016-05-17 NOTE — Therapy (Signed)
[place] [facility] SERVICES [address] [place], [place], [address] Phone: [phone]   Fax:  [phone]  Physical Therapy Treatment  Patient Details  Name: Veronica Olsen MRN: [id number] Date of Birth: 08-Sep-1941 No Data Recorded  Encounter Date: 05/17/2016      PT End of Session - 05/17/16 1202    Visit Number 18   Number of Visits 25   Date for PT Re-Evaluation 05/18/16   Authorization Type 8/10   PT Start Time 1118   PT Stop Time 1200   PT Time Calculation (min) 42 min   Activity Tolerance Patient tolerated treatment well   Behavior During Therapy [facility] for tasks assessed/performed      History reviewed. No pertinent past medical history.  Past Surgical History:  Procedure Laterality Date  . APPENDECTOMY     AGE 89  . SHOULDER ARTHROSCOPY WITH OPEN ROTATOR CUFF REPAIR AND DISTAL CLAVICLE ACROMINECTOMY Right 03/09/2016   Procedure: SHOULDER ARTHROSCOPY WITH OPEN ROTATOR CUFF REPAIR AND DISTAL CLAVICLE ACROMINECTOMY;  Surgeon: Thornton Park, MD;  Location: [facility];  Service: Orthopedics;  Laterality: Right;    There were no vitals filed for this visit.      Subjective Assessment - 05/17/16 1201    Subjective Pt states she is a little sore from performing her exercises over the weekend, but denies pain in the shoulder.   Diagnostic tests MRI   Patient Stated Goals Pt reports she would like to raise her arm overhead   Currently in Pain? No/denies   Pain Onset 1 to 4 weeks ago       Manual: PROM shoulder flexion, abduction, IR/ER x 5 each direction (10 min)  Therex: Sidelying ER with 1# DB, 2x15; min verbal cues for proper technique Sidelying active shoulder flexion, abduction, 2x10 each; pt demonstrated improved quality of movement during abduction with minimal UT compensation; Pt required mod tactile cues for proper scapular stabilization prior to performing flexion; pt able to reach about shoulder level with  flexion and approximately 120 deg abduction AAROM with towel on wall shoulder flexion 3x15; mod tactile cues for proper scapular stabilization prior to movement; pt demonstrated improved ability to decrease UT compensation throughout and able to get arm approximately 120 deg  Standing IR/ER with RTB, 3x15 each; min verbal cues to decrease trunk rotation Bent over rows, shoulder extension with 2.5# cuff weight, 2x15; min verbal cues for proper technique  Educated pt to add sidelying flexion, abduction, and towel slides on wall for flexion to HEP.        PT Education - 05/17/16 1202    Education provided Yes   Education Details decreased UT compensation, exercise technique, HEP   Person(s) Educated Patient   Methods Explanation   Comprehension Verbalized understanding             PT Long Term Goals - 04/20/16 1355      PT LONG TERM GOAL #1   Title pt will demonstrate at least 140 deg R shoulder PROM to return to funciton   Time 4   Period Weeks   Status Partially Met     PT LONG TERM GOAL #2   Title pt will be independent and compliant with HEP   Time 8   Period Weeks   Status Partially Met     PT LONG TERM GOAL #3   Title pt will demonstrate 140 deg of R shoulder AROM to return to ADLs   Time 12  Period Weeks   Status Deferred     PT LONG TERM GOAL #4   Title pt will be independent and compliant with progressive strengthening program to return to PLOF   Time 12   Period Weeks   Status Deferred     PT LONG TERM GOAL #5   Title pt will be able to improve shoulder IR to 45 deg to be able to fasten her bra   Time 12   Period Weeks   Status Deferred               Plan - 05/17/16 1203    Clinical Impression Statement Pt continues to make progress towards goals. She demonstrated improved AROM with gravity eliminated and with towel slides on wall for flexion and abduction. Pt continues to require verbal and tactile cues for proper scapular stabilization  prior to movement and decreased UT compensation throughout. Pt demonstrated muscle fatigue towards end of session. Pt needs continued skilled PT intervention to maximize overall function.   Rehab Potential Good   Clinical Impairments Affecting Rehab Potential age, complexity of surgery   PT Frequency 2x / week   PT Duration 12 weeks   PT Treatment/Interventions ADLs/Self Care Home Management;Aquatic Therapy;Cryotherapy;Electrical Stimulation;Moist Heat;Ultrasound;Patient/family education;Therapeutic exercise;Therapeutic activities;Functional mobility training;Manual techniques;Dry needling;Passive range of motion;Taping   PT Next Visit Plan Due for Recert - repeat functional outcome measures, AROM, strengthening   PT Home Exercise Plan Progressed HEP, re-educated on HEP   Consulted and Agree with Plan of Care Patient      Patient will benefit from skilled therapeutic intervention in order to improve the following deficits and impairments:  Decreased activity tolerance, Decreased endurance, Decreased range of motion, Decreased safety awareness, Decreased strength, Hypomobility, Impaired UE functional use, Pain, Impaired sensation  Visit Diagnosis: Pain in right shoulder  Abnormal posture     Problem List Patient Active Problem List   Diagnosis Date Noted  . Full thickness rotator cuff tear 03/09/2016   [name], SPT  This entire session was performed under direct supervision and direction of a licensed therapist/therapist assistant . I have personally read, edited and approve of the note as written. Gorden Harms. Tortorici, PT, DPT [phone]   Tortorici,Ashley 05/17/2016, 2:00 PM  [place] MAIN [facility] SERVICES [address] [place], [place], [address] Phone: [phone]   Fax:  [phone]  Name: Veronica Olsen MRN: [id number] Date of Birth: [birth year]

## 2016-05-18 DIAGNOSIS — M25511 Pain in right shoulder: Secondary | ICD-10-CM | POA: Diagnosis not present

## 2016-05-22 ENCOUNTER — Ambulatory Visit: Payer: PPO

## 2016-05-22 DIAGNOSIS — R293 Abnormal posture: Secondary | ICD-10-CM

## 2016-05-22 DIAGNOSIS — M25511 Pain in right shoulder: Secondary | ICD-10-CM

## 2016-05-22 NOTE — Therapy (Signed)
[place] [facility] MAIN [facility] SERVICES [address] [place], [place], [address] Phone: [phone]   Fax:  [phone]  Physical Therapy Treatment/Progress Note  Patient Details  Name: Veronica Olsen MRN: [id number] Date of Birth: 08/02/1941 No Data Recorded  Encounter Date: 05/22/2016      PT End of Session - 05/22/16 1144    Visit Number 19   Number of Visits 33   Date for PT Re-Evaluation 06/19/16   Authorization Type 1/10   Authorization - Visit Number --   PT Start Time 1115   PT Stop Time 1205   PT Time Calculation (min) 50 min   Activity Tolerance Patient tolerated treatment well   Behavior During Therapy [facility] for tasks assessed/performed      History reviewed. No pertinent past medical history.  Past Surgical History:  Procedure Laterality Date  . APPENDECTOMY     AGE 27  . SHOULDER ARTHROSCOPY WITH OPEN ROTATOR CUFF REPAIR AND DISTAL CLAVICLE ACROMINECTOMY Right 03/09/2016   Procedure: SHOULDER ARTHROSCOPY WITH OPEN ROTATOR CUFF REPAIR AND DISTAL CLAVICLE ACROMINECTOMY;  Surgeon: Juanell Fairly, MD;  Location: [facility] ORS;  Service: Orthopedics;  Laterality: Right;    There were no vitals filed for this visit.      Subjective Assessment - 05/22/16 1141    Subjective Pt reports her shoulder is sore this morning but denies any pain.   Diagnostic tests MRI   Patient Stated Goals Pt reports she would like to raise her arm overhead   Currently in Pain? No/denies   Pain Onset 1 to 4 weeks ago      Manual: PROM shoulder flexion, abduction, IR/ER x 5 each direction  Therex:   PROM (in deg) (performed with pt in modified supine)  Left Right  Shoulder flexion  148  Shoulder extension    Shoulder abduction  100  Shoulder IR (in slight scaption)  82  Shoulder ER (in slight scaption)  34   AROM (in deg) (performed with pt in sitting)  Left Right  Shoulder flexion  90  Shoulder extension    Shoulder abduction  74  Shoulder  IR  47  Shoulder ER (elbow at 90 deg)  38    Sidelying ER with 2.5# cuff weight, 2x10; pt demonstrated slight fatigue following Sidelying shoulder flexion, abduction, 2x10 each; min verbal cues for proper technique and decreased UT compensation Towel slides on wall for shoulder flexion, rainbows, 2x10 each; min verbal cues for decreased UT compensation; pt demonstrated fatigue at end of session  QuickDASH: 38% out of 100        PT Education - 05/22/16 1143    Education provided Yes   Education Details progress note, exercise techinque, compensation   Person(s) Educated Patient   Methods Explanation   Comprehension Verbalized understanding             PT Long Term Goals - 05/22/16 1204      PT LONG TERM GOAL #1   Title pt will demonstrate at least 140 deg R shoulder PROM to return to funciton   Baseline 148 (05/22/16)   Time 4   Period Weeks   Status Achieved     PT LONG TERM GOAL #2   Title pt will be independent and compliant with HEP   Time 8   Period Weeks   Status Achieved     PT LONG TERM GOAL #3   Title pt will demonstrate 140 deg of R shoulder AROM to return to  ADLs   Baseline 90 flex, 74 abd, 47 IR, 38 ER   Time 12   Period Weeks   Status On-going     PT LONG TERM GOAL #4   Title pt will be independent and compliant with progressive strengthening program to return to PLOF   Time 12   Period Weeks   Status On-going     PT LONG TERM GOAL #5   Title pt will be able to improve shoulder IR to 45 deg to be able to fasten her bra   Time 12   Period Weeks   Status On-going     Additional Long Term Goals   Additional Long Term Goals Yes     PT LONG TERM GOAL #6   Title pt will have improved QuickDASH score by >8 points to demonstrate improved overall function.   Baseline 38% 18-Jun-2023)   Time 8   Period Weeks   Status New               Plan - June 17, 2016 1207    Clinical Impression Statement SPT reassessed pt's PROM this date as well as AROM.  Pt making progress as evidenced by improved PROM, but pt continues to demonstrate increased muscle guarding throughout all ROM and requires frequent verbal/tactile cues for relaxation and to decrease UT compensation. Pt given QuickDASH this date which rendered score of 38% indicating mild disability due to her shoulder pain. Pt improving with strengthening and AAROM exercises and needs continued skilled PT intervention to maximize overall function and independence.   Rehab Potential Good   Clinical Impairments Affecting Rehab Potential age, complexity of surgery   PT Frequency 2x / week   PT Duration 12 weeks   PT Treatment/Interventions ADLs/Self Care Home Management;Aquatic Therapy;Cryotherapy;Electrical Stimulation;Moist Heat;Ultrasound;Patient/family education;Therapeutic exercise;Therapeutic activities;Functional mobility training;Manual techniques;Dry needling;Passive range of motion;Taping   PT Next Visit Plan Due for Recert - repeat functional outcome measures, AROM, strengthening   PT Home Exercise Plan Progressed HEP, re-educated on HEP   Consulted and Agree with Plan of Care Patient      Patient will benefit from skilled therapeutic intervention in order to improve the following deficits and impairments:  Decreased activity tolerance, Decreased endurance, Decreased range of motion, Decreased safety awareness, Decreased strength, Hypomobility, Impaired UE functional use, Pain, Impaired sensation  Visit Diagnosis: Pain in right shoulder  Abnormal posture       G-Codes - 06-17-16 1735    Functional Assessment Tool Used ROM   Functional Limitation Carrying, moving and handling objects   Carrying, Moving and Handling Objects Current Status (F6213) At least 20 percent but less than 40 percent impaired, limited or restricted   Carrying, Moving and Handling Objects Goal Status (Y8657) At least 1 percent but less than 20 percent impaired, limited or restricted      Problem  List Patient Active Problem List   Diagnosis Date Noted  . Full thickness rotator cuff tear 03/09/2016   Jac Canavan, SPT This entire session was performed under direct supervision and direction of a licensed therapist/therapist assistant . I have personally read, edited and approve of the note as written. Carlyon Shadow. Tortorici, PT, DPT [phone]  Tortorici,Ashley 17-Jun-2016, 5:36 PM  [place] [facility] MAIN [facility] SERVICES [address] [place], [place], [address] Phone: [phone]   Fax:  [phone]  Name: Veronica Olsen MRN: [id number] Date of Birth: 11/28/1940

## 2016-05-24 ENCOUNTER — Ambulatory Visit: Payer: PPO | Attending: Orthopedic Surgery

## 2016-05-24 DIAGNOSIS — R293 Abnormal posture: Secondary | ICD-10-CM | POA: Diagnosis not present

## 2016-05-24 DIAGNOSIS — M25511 Pain in right shoulder: Secondary | ICD-10-CM | POA: Diagnosis not present

## 2016-05-24 NOTE — Therapy (Signed)
[place] [facility] MAIN [facility] SERVICES [address] [place], [place], [address] Phone: [phone]   Fax:  [phone]  Physical Therapy Treatment  Patient Details  Name: Veronica Olsen MRN: [id number] Date of Birth: [birth year] No Data Recorded  Encounter Date: 05/24/2016      PT End of Session - 05/24/16 1128    Visit Number 20   Number of Visits 33   Date for PT Re-Evaluation 06/19/16   Authorization Type 2/10   PT Start Time 1118   PT Stop Time 1158   PT Time Calculation (min) 40 min   Activity Tolerance Patient tolerated treatment well   Behavior During Therapy [facility] for tasks assessed/performed      History reviewed. No pertinent past medical history.  Past Surgical History:  Procedure Laterality Date  . APPENDECTOMY     AGE 75  . SHOULDER ARTHROSCOPY WITH OPEN ROTATOR CUFF REPAIR AND DISTAL CLAVICLE ACROMINECTOMY Right 03/09/2016   Procedure: SHOULDER ARTHROSCOPY WITH OPEN ROTATOR CUFF REPAIR AND DISTAL CLAVICLE ACROMINECTOMY;  Surgeon: Juanell Fairly, MD;  Location: [facility] ORS;  Service: Orthopedics;  Laterality: Right;    There were no vitals filed for this visit.      Subjective Assessment - 05/24/16 1128    Subjective Pt states she attempted to fasten her bra but was unable due to pain and inability to reach arm high enough behind back.   Diagnostic tests MRI   Patient Stated Goals Pt reports she would like to raise her arm overhead   Currently in Pain? No/denies   Pain Onset 1 to 4 weeks ago       Manual:  PROM shoulder flexion, abd, IR/ER, x 5 each; min verbal/tactile cues for relaxation  Therex: Sidelying shoulder flexion and abduction, 2x10 each; AAROM of scapula improved ROM of shoulder in both directions Sidelying ER with 2.5# cuff weight, 2x10 Wall slides with towel, shoulder flexion 2x20, rainbows 2x10; min verbal cues for proper scap stabilization prior to shoulder movement  Ball circles (in/out) on wall for  scap stabilization with small red weighted ball, 2x10 each; min verbal cues for decrease UT compensation AROM shoulder flexion and abduction, 2x12 each; pt had decreased UT recruitment with tactile feedback of middle delt during abd and ant delt for flexion Inverted Y's with 3 sec hold, 2x10; min verbal cues for proper technique Standing IR/ER with RTB and towel roll, 3x15         PT Education - 05/24/16 1129    Education provided Yes   Education Details exercise technique, relaxation, decrease UT compensation   Person(s) Educated Patient   Methods Explanation   Comprehension Verbalized understanding             PT Long Term Goals - 05/22/16 1204      PT LONG TERM GOAL #1   Title pt will demonstrate at least 140 deg R shoulder PROM to return to funciton   Baseline 148 (05/22/16)   Time 4   Period Weeks   Status Achieved     PT LONG TERM GOAL #2   Title pt will be independent and compliant with HEP   Time 8   Period Weeks   Status Achieved     PT LONG TERM GOAL #3   Title pt will demonstrate 140 deg of R shoulder AROM to return to ADLs   Baseline 90 flex, 74 abd, 47 IR, 38 ER   Time 12   Period Weeks   Status On-going  PT LONG TERM GOAL #4   Title pt will be independent and compliant with progressive strengthening program to return to PLOF   Time 12   Period Weeks   Status On-going     PT LONG TERM GOAL #5   Title pt will be able to improve shoulder IR to 45 deg to be able to fasten her bra   Time 12   Period Weeks   Status On-going     Additional Long Term Goals   Additional Long Term Goals Yes     PT LONG TERM GOAL #6   Title pt will have improved QuickDASH score by >8 points to demonstrate improved overall function.   Baseline 38% (7/31)   Time 8   Period Weeks   Status New               Plan - 05/24/16 1207    Clinical Impression Statement Pt making progress towards goals. Her main limiting factor is UT recruitment for elevation  activities. She has improved technique with tactile cues to deltoid and with scapular upward rotation assist during exercises. Pt needs continued skilled PT intervention to maximize overall function.   Rehab Potential Good   Clinical Impairments Affecting Rehab Potential age, complexity of surgery   PT Frequency 2x / week   PT Duration 12 weeks   PT Treatment/Interventions ADLs/Self Care Home Management;Aquatic Therapy;Cryotherapy;Electrical Stimulation;Moist Heat;Ultrasound;Patient/family education;Therapeutic exercise;Therapeutic activities;Functional mobility training;Manual techniques;Dry needling;Passive range of motion;Taping   PT Next Visit Plan Due for Recert - repeat functional outcome measures, AROM, strengthening   PT Home Exercise Plan Progressed HEP, re-educated on HEP   Consulted and Agree with Plan of Care Patient      Patient will benefit from skilled therapeutic intervention in order to improve the following deficits and impairments:  Decreased activity tolerance, Decreased endurance, Decreased range of motion, Decreased safety awareness, Decreased strength, Hypomobility, Impaired UE functional use, Pain, Impaired sensation  Visit Diagnosis: Pain in right shoulder  Abnormal posture     Problem List Patient Active Problem List   Diagnosis Date Noted  . Full thickness rotator cuff tear 03/09/2016   Jac Canavan, SPT This entire session was performed under direct supervision and direction of a licensed therapist/therapist assistant . I have personally read, edited and approve of the note as written. Carlyon Shadow. Tortorici, PT, DPT [phone]  Tortorici,Ashley 05/24/2016, 3:19 PM  [place] [facility] MAIN [facility] SERVICES [address] [place], [place], [address] Phone: [phone]   Fax:  [phone]  Name: INAS AVENA MRN: [id number] Date of Birth: 08-25-1941

## 2016-05-29 ENCOUNTER — Ambulatory Visit: Payer: PPO

## 2016-05-29 DIAGNOSIS — M25511 Pain in right shoulder: Secondary | ICD-10-CM | POA: Diagnosis not present

## 2016-05-29 DIAGNOSIS — R293 Abnormal posture: Secondary | ICD-10-CM

## 2016-05-29 NOTE — Therapy (Signed)
[facility] MAIN [address] [facility] SERVICES [address] [place], [place], [address] Phone: [phone]   Fax:  [phone]  Physical Therapy Treatment  Patient Details  Name: Veronica Olsen MRN: [id number] Date of Birth: [birth year] No Data Recorded  Encounter Date: 05/29/2016      PT End of Session - 05/29/16 1135    Visit Number 21   Number of Visits 33   Date for PT Re-Evaluation 06/19/16   Authorization Type 3/10   PT Start Time 1118   PT Stop Time 1200   PT Time Calculation (min) 42 min   Activity Tolerance Patient tolerated treatment well   Behavior During Therapy [facility] for tasks assessed/performed      History reviewed. No pertinent past medical history.  Past Surgical History:  Procedure Laterality Date  . APPENDECTOMY     AGE 16  . SHOULDER ARTHROSCOPY WITH OPEN ROTATOR CUFF REPAIR AND DISTAL CLAVICLE ACROMINECTOMY Right 03/09/2016   Procedure: SHOULDER ARTHROSCOPY WITH OPEN ROTATOR CUFF REPAIR AND DISTAL CLAVICLE ACROMINECTOMY;  Surgeon: Juanell Fairly, MD;  Location: [facility] ORS;  Service: Orthopedics;  Laterality: Right;    There were no vitals filed for this visit.      Subjective Assessment - 05/29/16 1133    Subjective Pt reports she was able to shave her armpits this weekend with no pain, just a little pull at end range.   Diagnostic tests MRI   Patient Stated Goals Pt reports she would like to raise her arm overhead   Currently in Pain? No/denies   Pain Onset 1 to 4 weeks ago      Therex: UE ranger on wall, axis point at shoulder level: flexion, scaption, abd, CW/CCW circles, OH horizontal abd/add, ABCs, 1x30 each; pt reported slight pull at end range throughout; min verbal cues for decrease UT compensation througout Sidelying ER with towel roll and 2.5# cuff weight, 3x10 Bil low rows and scap retraction with GTB, 3x10 each; min verbal/tactile cues for decreased UT compensation and min verbal cues for proper technique Bent  over I's, Y's, T's, 1x10 no weight, 2x10 with 1# DB; pt required min verbal/tactile cues to decrease UT compensation; she demonstrated increased difficulty with abduction Bent over shoulder extension and rowing with 2.5# cuff weight; min verbal cues for proper technique  Manual: PROM R shoulder flexion, abd, IR/ER x 10 each direction; pt demonstrated improved PROM throughout, continued c/o pulling at end range      PT Education - 05/29/16 1134    Education provided Yes   Education Details decrease UT compensation, exercise technique   Person(s) Educated Patient   Methods Explanation   Comprehension Verbalized understanding             PT Long Term Goals - 05/22/16 1204      PT LONG TERM GOAL #1   Title pt will demonstrate at least 140 deg R shoulder PROM to return to funciton   Baseline 148 (05/22/16)   Time 4   Period Weeks   Status Achieved     PT LONG TERM GOAL #2   Title pt will be independent and compliant with HEP   Time 8   Period Weeks   Status Achieved     PT LONG TERM GOAL #3   Title pt will demonstrate 140 deg of R shoulder AROM to return to ADLs   Baseline 90 flex, 74 abd, 47 IR, 38 ER   Time 12   Period Weeks   Status On-going  PT LONG TERM GOAL #4   Title pt will be independent and compliant with progressive strengthening program to return to PLOF   Time 12   Period Weeks   Status On-going     PT LONG TERM GOAL #5   Title pt will be able to improve shoulder IR to 45 deg to be able to fasten her bra   Time 12   Period Weeks   Status On-going     Additional Long Term Goals   Additional Long Term Goals Yes     PT LONG TERM GOAL #6   Title pt will have improved QuickDASH score by >8 points to demonstrate improved overall function.   Baseline 38% (7/31)   Time 8   Period Weeks   Status New               Plan - 05/29/16 1202    Clinical Impression Statement Pt continues to make progress towards goals. Her PROM was improved this  date and she tolerated strengthening exercises well. Pt reported improved functional use of RUE at home as reported by ability to shave armpits. Pt needs continued skilled PT intervention to maximize overall function.   Rehab Potential Good   Clinical Impairments Affecting Rehab Potential age, complexity of surgery   PT Frequency 2x / week   PT Duration 12 weeks   PT Treatment/Interventions ADLs/Self Care Home Management;Aquatic Therapy;Cryotherapy;Electrical Stimulation;Moist Heat;Ultrasound;Patient/family education;Therapeutic exercise;Therapeutic activities;Functional mobility training;Manual techniques;Dry needling;Passive range of motion;Taping   PT Next Visit Plan Due for Recert - repeat functional outcome measures, AROM, strengthening   PT Home Exercise Plan Progressed HEP, re-educated on HEP   Consulted and Agree with Plan of Care Patient      Patient will benefit from skilled therapeutic intervention in order to improve the following deficits and impairments:  Decreased activity tolerance, Decreased endurance, Decreased range of motion, Decreased safety awareness, Decreased strength, Hypomobility, Impaired UE functional use, Pain, Impaired sensation  Visit Diagnosis: Pain in right shoulder  Abnormal posture     Problem List Patient Active Problem List   Diagnosis Date Noted  . Full thickness rotator cuff tear 03/09/2016   Jac CanavanBrooke Montanna Mcbain, SPT This entire session was performed under direct supervision and direction of a licensed therapist/therapist assistant . I have personally read, edited and approve of the note as written. Carlyon ShadowAshley C. Tortorici, PT, DPT [phone]  Tortorici,Ashley 05/29/2016, 4:56 PM  [place] [facility] [address] [place], [place], [address] Phone: [phone]   Fax:  [phone]  Name: Hilarie FredricksonMarjorie M Welch MRN: [id number] Date of Birth: 09/07/1941

## 2016-05-31 ENCOUNTER — Ambulatory Visit: Payer: PPO

## 2016-05-31 DIAGNOSIS — R293 Abnormal posture: Secondary | ICD-10-CM

## 2016-05-31 DIAGNOSIS — M25511 Pain in right shoulder: Secondary | ICD-10-CM | POA: Diagnosis not present

## 2016-05-31 NOTE — Therapy (Signed)
[place] [facility] [address] [place], [place], [address] Phone: [phone]   Fax:  [phone]  Physical Therapy Treatment  Patient Details  Name: Veronica Olsen MRN: [id number] Date of Birth: 01/12/1941 No Data Recorded  Encounter Date: 05/31/2016      PT End of Session - 05/31/16 1143    Visit Number 22   Number of Visits 33   Date for PT Re-Evaluation 06/19/16   Authorization Type 4/10   PT Start Time 1117   PT Stop Time 1200   PT Time Calculation (min) 43 min   Activity Tolerance Patient tolerated treatment well   Behavior During Therapy [facility] for tasks assessed/performed      History reviewed. No pertinent past medical history.  Past Surgical History:  Procedure Laterality Date  . APPENDECTOMY     AGE 67  . SHOULDER ARTHROSCOPY WITH OPEN ROTATOR CUFF REPAIR AND DISTAL CLAVICLE ACROMINECTOMY Right 03/09/2016   Procedure: SHOULDER ARTHROSCOPY WITH OPEN ROTATOR CUFF REPAIR AND DISTAL CLAVICLE ACROMINECTOMY;  Surgeon: Juanell FairlyKevin Krasinski, MD;  Location: [facility] ORS;  Service: Orthopedics;  Laterality: Right;    There were no vitals filed for this visit.      Subjective Assessment - 05/31/16 1140    Subjective pt reports her shoulder is just a little sore and she is tired today, but denies shoulder pain.   Diagnostic tests MRI   Patient Stated Goals Pt reports she would like to raise her arm overhead   Currently in Pain? No/denies   Pain Onset 1 to 4 weeks ago       Manual: R shoulder PROM into shoulder flexion, abduction, IR/ER, 2x5 each; pt with increased tightness this date but improved following PROM  Therex: Sidelying ER with 2.5# cuff weight, 3x12; pt demonstrated increased fatigue Sidelying shoulder flexion, abduction, 2x12 each; pt demonstrated improved ROM with both and needed less cues for decrease UT compensation UE ranger on wall with axis at shoulder level: flexion, scaption, abduction, OH CCW/CW  circles, OH horizontal add/abd (elbow slightly bent to decrease UT comp), ABCs, 2x30 each; min tactile/verbal cues to decrease UT compensation throughout, especially with abd; increased difficulty with OH CW circles; Bil low rows and scap retraction with RTB, 3x12; min verbal cues for proper technique        PT Education - 05/31/16 1140    Education provided Yes   Education Details exercise technique, decrease UT compensation   Person(s) Educated Patient   Methods Explanation   Comprehension Verbalized understanding             PT Long Term Goals - 05/22/16 1204      PT LONG TERM GOAL #1   Title pt will demonstrate at least 140 deg R shoulder PROM to return to funciton   Baseline 148 (05/22/16)   Time 4   Period Weeks   Status Achieved     PT LONG TERM GOAL #2   Title pt will be independent and compliant with HEP   Time 8   Period Weeks   Status Achieved     PT LONG TERM GOAL #3   Title pt will demonstrate 140 deg of R shoulder AROM to return to ADLs   Baseline 90 flex, 74 abd, 47 IR, 38 ER   Time 12   Period Weeks   Status On-going     PT LONG TERM GOAL #4   Title pt will be independent and compliant with progressive strengthening program to  return to PLOF   Time 12   Period Weeks   Status On-going     PT LONG TERM GOAL #5   Title pt will be able to improve shoulder IR to 45 deg to be able to fasten her bra   Time 12   Period Weeks   Status On-going     Additional Long Term Goals   Additional Long Term Goals Yes     PT LONG TERM GOAL #6   Title pt will have improved QuickDASH score by >8 points to demonstrate improved overall function.   Baseline 38% (7/31)   Time 8   Period Weeks   Status New               Plan - 05/31/16 1154    Clinical Impression Statement Pt continues to make steady progress towards goals. She had increased tightness with PROM this date but that loosened up following treatment. She continues to demonstrate fatigue with  strengthening exercises. She is still unable to fasten her bra behind her back but SPT educated pt to attempt fastening it in the front and then spinning it around.   Rehab Potential Good   Clinical Impairments Affecting Rehab Potential age, complexity of surgery   PT Frequency 2x / week   PT Duration 12 weeks   PT Treatment/Interventions ADLs/Self Care Home Management;Aquatic Therapy;Cryotherapy;Electrical Stimulation;Moist Heat;Ultrasound;Patient/family education;Therapeutic exercise;Therapeutic activities;Functional mobility training;Manual techniques;Dry needling;Passive range of motion;Taping   PT Next Visit Plan Due for Recert - repeat functional outcome measures, AROM, strengthening   PT Home Exercise Plan Progressed HEP, re-educated on HEP   Consulted and Agree with Plan of Care Patient      Patient will benefit from skilled therapeutic intervention in order to improve the following deficits and impairments:  Decreased activity tolerance, Decreased endurance, Decreased range of motion, Decreased safety awareness, Decreased strength, Hypomobility, Impaired UE functional use, Pain, Impaired sensation  Visit Diagnosis: Pain in right shoulder  Abnormal posture     Problem List Patient Active Problem List   Diagnosis Date Noted  . Full thickness rotator cuff tear 03/09/2016   Veronica Olsen, SPT This entire session was performed under direct supervision and direction of a licensed therapist/therapist assistant . I have personally read, edited and approve of the note as written. Carlyon Shadow. Tortorici, PT, DPT [phone]  Tortorici,Ashley 05/31/2016, 3:13 PM  [place] [facility] MAIN [facility] SERVICES [address] [place], [place], [address] Phone: [phone]   Fax:  [phone]  Name: Veronica Olsen MRN: [id number] Date of Birth: Oct 17, 1941

## 2016-06-05 ENCOUNTER — Ambulatory Visit: Payer: PPO

## 2016-06-05 DIAGNOSIS — M25511 Pain in right shoulder: Secondary | ICD-10-CM

## 2016-06-05 DIAGNOSIS — R293 Abnormal posture: Secondary | ICD-10-CM

## 2016-06-05 NOTE — Therapy (Signed)
[facility] [address] [place], [place], [address] Phone: [phone]   Fax:  [phone]  Physical Therapy Treatment  Patient Details  Name: Veronica FredricksonMarjorie M Baize MRN: [id number] Date of Birth: 09/28/1941 No Data Recorded  Encounter Date: 06/05/2016      PT End of Session - 06/05/16 1204    Visit Number 23   Number of Visits 33   Date for PT Re-Evaluation 06/19/16   Authorization Type 5/10   PT Start Time 1117   PT Stop Time 1202   PT Time Calculation (min) 45 min   Activity Tolerance Patient tolerated treatment well   Behavior During Therapy [facility] for tasks assessed/performed      History reviewed. No pertinent past medical history.  Past Surgical History:  Procedure Laterality Date  . APPENDECTOMY     AGE 75  . SHOULDER ARTHROSCOPY WITH OPEN ROTATOR CUFF REPAIR AND DISTAL CLAVICLE ACROMINECTOMY Right 03/09/2016   Procedure: SHOULDER ARTHROSCOPY WITH OPEN ROTATOR CUFF REPAIR AND DISTAL CLAVICLE ACROMINECTOMY;  Surgeon: Juanell FairlyKevin Krasinski, MD;  Location: [facility] ORS;  Service: Orthopedics;  Laterality: Right;    There were no vitals filed for this visit.      Subjective Assessment - 06/05/16 1202    Subjective Pt able to fasten bra in front, turn around, and slip RUE in with minimal pain but stated it was slightly difficult when putting R arm in through strap due to tightness.   Diagnostic tests MRI   Patient Stated Goals Pt reports she would like to raise her arm overhead   Currently in Pain? No/denies   Pain Onset 1 to 4 weeks ago      Manual: R PROM shoulder flexion, abduction, IR/ER, 1x10 each direction; improved PROM following with decreased pain at end range Muscle stripping and cross friction soft tissue techniques to R biceps muscle belly and distal anterior delt x 12 mins; pt reported decreased pain and decreased stiffness following  Therex: UE ranger on wall shoulder flexion, scaption, abduction, ABCs,  OH CCW/CW circles, 1x30 each; min verbal cues to decrease UT compensation, however pt demonstrated slight improvement since last treatment session Active shoulder flexion, abduction, 3x10 each; min verbal/tactile cues for decrease UT compensation; improved quality of movement with scapular assistance with each; increased difficulty with shoulder abd, pt demonstrated fatigue Bil low rows, scap retraction 1x10 each with RTB, 2x10 each with GTB; min verbal cues for proper technique        PT Education - 06/05/16 1204    Education provided Yes   Education Details exercise technique   Person(s) Educated Patient   Methods Explanation   Comprehension Verbalized understanding             PT Long Term Goals - 05/22/16 1204      PT LONG TERM GOAL #1   Title pt will demonstrate at least 140 deg R shoulder PROM to return to funciton   Baseline 148 (05/22/16)   Time 4   Period Weeks   Status Achieved     PT LONG TERM GOAL #2   Title pt will be independent and compliant with HEP   Time 8   Period Weeks   Status Achieved     PT LONG TERM GOAL #3   Title pt will demonstrate 140 deg of R shoulder AROM to return to ADLs   Baseline 90 flex, 74 abd, 47 IR, 38 ER   Time 12   Period Weeks   Status  On-going     PT LONG TERM GOAL #4   Title pt will be independent and compliant with progressive strengthening program to return to PLOF   Time 12   Period Weeks   Status On-going     PT LONG TERM GOAL #5   Title pt will be able to improve shoulder IR to 45 deg to be able to fasten her bra   Time 12   Period Weeks   Status On-going     Additional Long Term Goals   Additional Long Term Goals Yes     PT LONG TERM GOAL #6   Title pt will have improved QuickDASH score by >8 points to demonstrate improved overall function.   Baseline 38% (7/31)   Time 8   Period Weeks   Status New               Plan - 06/05/16 1204    Clinical Impression Statement Pt continues to make progress  as evidenced by improved AROM this date. Her main limiting factor continues to be UT compensation with OH movements, which requires cues for correction. Pt with increased soft tissue restrictions in biceps and anterior delt this date.   Rehab Potential Good   Clinical Impairments Affecting Rehab Potential age, complexity of surgery   PT Frequency 2x / week   PT Duration 12 weeks   PT Treatment/Interventions ADLs/Self Care Home Management;Aquatic Therapy;Cryotherapy;Electrical Stimulation;Moist Heat;Ultrasound;Patient/family education;Therapeutic exercise;Therapeutic activities;Functional mobility training;Manual techniques;Dry needling;Passive range of motion;Taping   PT Next Visit Plan Due for Recert - repeat functional outcome measures, AROM, strengthening   PT Home Exercise Plan Progressed HEP, re-educated on HEP   Consulted and Agree with Plan of Care Patient      Patient will benefit from skilled therapeutic intervention in order to improve the following deficits and impairments:  Decreased activity tolerance, Decreased endurance, Decreased range of motion, Decreased safety awareness, Decreased strength, Hypomobility, Impaired UE functional use, Pain, Impaired sensation  Visit Diagnosis: Pain in right shoulder  Abnormal posture     Problem List Patient Active Problem List   Diagnosis Date Noted  . Full thickness rotator cuff tear 03/09/2016   Jac CanavanBrooke Jamale Spangler, SPT This entire session was performed under direct supervision and direction of a licensed therapist/therapist assistant . I have personally read, edited and approve of the note as written.  Tortorici,Ashley 06/05/2016, 4:59 PM  [place] [facility] [address] [place], [place], [address] Phone: [phone]   Fax:  [phone]  Name: Veronica FredricksonMarjorie M Kudrna MRN: [id number] Date of Birth: 12/15/1940

## 2016-06-07 ENCOUNTER — Ambulatory Visit: Payer: PPO

## 2016-06-07 DIAGNOSIS — M25511 Pain in right shoulder: Secondary | ICD-10-CM

## 2016-06-07 DIAGNOSIS — R293 Abnormal posture: Secondary | ICD-10-CM

## 2016-06-07 NOTE — Therapy (Signed)
[facility] [address] , [place], [address] Phone: [phone]   Fax:  [phone]  Physical Therapy Treatment  Patient Details  Name: Veronica FredricksonMarjorie M Inga MRN: [id number] Date of Birth: 09/12/1941 No Data Recorded  Encounter Date: 06/07/2016      PT End of Session - 06/07/16 1121    Visit Number 24   Number of Visits 33   Date for PT Re-Evaluation 06/19/16   Authorization Type 6/10   PT Start Time 1110   PT Stop Time 1153   PT Time Calculation (min) 43 min   Activity Tolerance Patient tolerated treatment well   Behavior During Therapy [facility] for tasks assessed/performed      History reviewed. No pertinent past medical history.  Past Surgical History:  Procedure Laterality Date  . APPENDECTOMY     AGE 75  . SHOULDER ARTHROSCOPY WITH OPEN ROTATOR CUFF REPAIR AND DISTAL CLAVICLE ACROMINECTOMY Right 03/09/2016   Procedure: SHOULDER ARTHROSCOPY WITH OPEN ROTATOR CUFF REPAIR AND DISTAL CLAVICLE ACROMINECTOMY;  Surgeon: Juanell FairlyKevin Krasinski, MD;  Location: [facility] ORS;  Service: Orthopedics;  Laterality: Right;    There were no vitals filed for this visit.      Subjective Assessment - 06/07/16 1120    Subjective Pt reports her arm is sore this morning but denies pain. She is being compliant with HEP.   Diagnostic tests MRI   Patient Stated Goals Pt reports she would like to raise her arm overhead   Currently in Pain? No/denies   Pain Onset 1 to 4 weeks ago       Manual: PROM R shoulder flexion, abd, IR/ER 1x5 each directions, 2x5 abd due to it being most restricted; pt demonstrated improved PROM throughout, slight tightness at end range throughout  Therex: Sidelying R shoulder ER with 2.5# cuff weight, 1x20, 1x10; min verbal cues for proper technique Sidelying R shoulder flexion and abduction, 2x15 each; min verbal/tactile cues for scap stabilization prior to movement bil which improved quality of movement and  decreased UT compensation throughout UE Ranger on wall (axis at shoulder level), shoulder flexion, scaption, abd, ABCs, OH CW/CCW circles, 1x30 each; min verbal cues for scap stabilization prior to [facility] elevation Standing bil ER with scap retraction with YTB, 3x10; min verbal cues for proper technique, min verbal/tactile cues for proper scap stab prior to performing movement Bent over Y's and T's with no weight, 2x15 each; mod tactile cues for proper scap stab prior to UE elevation, mod verbal cues to decrease UT compensation Standing low rows with GTB, 2x15; min tactile cues for scap stab and pt demonstrated improved quality of movement throughout       PT Education - 06/07/16 1121    Education provided Yes   Education Details (P)  exercise technique, decrease UT compensation, scap stab prior to UE elevation   Person(s) Educated Patient   Methods Explanation   Comprehension Verbalized understanding             PT Long Term Goals - 05/22/16 1204      PT LONG TERM GOAL #1   Title pt will demonstrate at least 140 deg R shoulder PROM to return to funciton   Baseline 148 (05/22/16)   Time 4   Period Weeks   Status Achieved     PT LONG TERM GOAL #2   Title pt will be independent and compliant with HEP   Time 8   Period Weeks   Status Achieved  PT LONG TERM GOAL #3   Title pt will demonstrate 140 deg of R shoulder AROM to return to ADLs   Baseline 90 flex, 74 abd, 47 IR, 38 ER   Time 12   Period Weeks   Status On-going     PT LONG TERM GOAL #4   Title pt will be independent and compliant with progressive strengthening program to return to PLOF   Time 12   Period Weeks   Status On-going     PT LONG TERM GOAL #5   Title pt will be able to improve shoulder IR to 45 deg to be able to fasten her bra   Time 12   Period Weeks   Status On-going     Additional Long Term Goals   Additional Long Term Goals Yes     PT LONG TERM GOAL #6   Title pt will have improved  QuickDASH score by >8 points to demonstrate improved overall function.   Baseline 38% (7/31)   Time 8   Period Weeks   Status New               Plan - 06/07/16 1157    Clinical Impression Statement Pt demonstrated improved PROM this date but continues with tightness at end range. Pt AAROM also continues to improve as demonstrated with performance on UE ranger. Pt scap stabilzing muscles continue to be weak and pt requires cues on how to proper engage scap stab prior to UE elevation. Pt needs continued skilled PT intevention to maximize overall function.   Rehab Potential Good   Clinical Impairments Affecting Rehab Potential age, complexity of surgery   PT Frequency 2x / week   PT Duration 12 weeks   PT Treatment/Interventions ADLs/Self Care Home Management;Aquatic Therapy;Cryotherapy;Electrical Stimulation;Moist Heat;Ultrasound;Patient/family education;Therapeutic exercise;Therapeutic activities;Functional mobility training;Manual techniques;Dry needling;Passive range of motion;Taping   PT Next Visit Plan Due for Recert - repeat functional outcome measures, AROM, strengthening   PT Home Exercise Plan Progressed HEP, re-educated on HEP   Consulted and Agree with Plan of Care Patient      Patient will benefit from skilled therapeutic intervention in order to improve the following deficits and impairments:  Decreased activity tolerance, Decreased endurance, Decreased range of motion, Decreased safety awareness, Decreased strength, Hypomobility, Impaired UE functional use, Pain, Impaired sensation  Visit Diagnosis: Pain in right shoulder  Abnormal posture     Problem List Patient Active Problem List   Diagnosis Date Noted  . Full thickness rotator cuff tear 03/09/2016   Jac CanavanBrooke Kawika Bischoff, SPT This entire session was performed under direct supervision and direction of a licensed therapist/therapist assistant . I have personally read, edited and approve of the note as  written. Carlyon ShadowAshley C. Tortorici, PT, DPT [phone]  Tortorici,Ashley 06/07/2016, 4:01 PM  [place] [facility] [address] [place], [place], [address] Phone: [phone]   Fax:  [phone]  Name: Veronica FredricksonMarjorie M Alderete MRN: [id number] Date of Birth: 04/06/1941

## 2016-06-12 ENCOUNTER — Ambulatory Visit: Payer: PPO

## 2016-06-12 DIAGNOSIS — M25511 Pain in right shoulder: Secondary | ICD-10-CM

## 2016-06-12 DIAGNOSIS — R293 Abnormal posture: Secondary | ICD-10-CM

## 2016-06-12 NOTE — Therapy (Signed)
[place] [facility] [address] [place], [place], [address] Phone: [phone]   Fax:  [phone]  Physical Therapy Treatment  Patient Details  Name: Veronica Olsen MRN: [id number] Date of Birth: 03/20/1941 No Data Recorded  Encounter Date: 06/12/2016      PT End of Session - 06/12/16 1240    Visit Number 25   Number of Visits 33   Date for PT Re-Evaluation 06/19/16   Authorization Type 7/10   PT Start Time 1120   PT Stop Time 1202   PT Time Calculation (min) 42 min   Activity Tolerance Patient tolerated treatment well   Behavior During Therapy [facility] for tasks assessed/performed      History reviewed. No pertinent past medical history.  Past Surgical History:  Procedure Laterality Date  . APPENDECTOMY     AGE 65  . SHOULDER ARTHROSCOPY WITH OPEN ROTATOR CUFF REPAIR AND DISTAL CLAVICLE ACROMINECTOMY Right 03/09/2016   Procedure: SHOULDER ARTHROSCOPY WITH OPEN ROTATOR CUFF REPAIR AND DISTAL CLAVICLE ACROMINECTOMY;  Surgeon: Juanell FairlyKevin Krasinski, MD;  Location: [facility] ORS;  Service: Orthopedics;  Laterality: Right;    There were no vitals filed for this visit.      Subjective Assessment - 06/12/16 1129    Subjective Pt reports her arm is sore this morning but denies pain. She is being compliant with HEP, believes her yellow theraband has become too easy.  Reports h/o numbness Rt thumb halfway up to forearm. Has talked to MD who states he plans to do a NCV test.   Diagnostic tests MRI   Patient Stated Goals Pt reports she would like to raise her arm overhead   Currently in Pain? Yes   Pain Score 2    Pain Location Shoulder   Pain Orientation Right   Pain Descriptors / Indicators Sore   Pain Type Surgical pain   Pain Onset More than a month ago               TREATMENT    Manual:  PROM R shoulder flexion, abd, IR/ER 1x5 each directions, 2x5 abd due to it being most restricted; pt demonstrated improved PROM  throughout, slight tightness at end range throughout  R shoulder grade 2-3 AP mobilization in supine 30 sec x3 in 0 deg abduction. Reports moderate throbbing pain with this. Improves with repetition; R shoulder grade 2-3 AP mobilization in supine 30 seconds x 3 in 45 deg abduction and end range ER; R shoulder grade 2-3 inferior mobilization in supine 30 sec x3 in 90 deg abduction.  Therex:  Sidelying R shoulder ER with 3# dumbell, 2x10, ; min verbal cues for proper technique  Sidelying R shoulder abduction w/ 1# dumbell, 2x10; "very little" pain with this.  Min tactile cues for shoulder depression during movement which improved quality of movement and decreased UT compensation throughout  Bil scap retraction with GTB, sitting 1x10, standing 1x10.   Standing low rows with GTB, 2x10; min verbal/tactile cues for scap stab/retraction and pt demonstrated improved quality of movement throughout  UE Ranger on wall (axis at shoulder level), shoulder flexion, scaption, , OH CW/CCW circles ( "just a little bit" of pain), 1x20 each; min verbal cues for scap stabilization prior to [facility] elevation;                 PT Education - 06/12/16 1240    Education provided Yes   Education Details Increased resistance, shoulder hiking, HEP reinforced   Person(s) Educated Patient  Methods Explanation   Comprehension Verbalized understanding             PT Long Term Goals - 05/22/16 1204      PT LONG TERM GOAL #1   Title pt will demonstrate at least 140 deg R shoulder PROM to return to funciton   Baseline 148 (05/22/16)   Time 4   Period Weeks   Status Achieved     PT LONG TERM GOAL #2   Title pt will be independent and compliant with HEP   Time 8   Period Weeks   Status Achieved     PT LONG TERM GOAL #3   Title pt will demonstrate 140 deg of R shoulder AROM to return to ADLs   Baseline 90 flex, 74 abd, 47 IR, 38 ER   Time 12   Period Weeks   Status On-going     PT LONG TERM GOAL #4    Title pt will be independent and compliant with progressive strengthening program to return to PLOF   Time 12   Period Weeks   Status On-going     PT LONG TERM GOAL #5   Title pt will be able to improve shoulder IR to 45 deg to be able to fasten her bra   Time 12   Period Weeks   Status On-going     Additional Long Term Goals   Additional Long Term Goals Yes     PT LONG TERM GOAL #6   Title pt will have improved QuickDASH score by >8 points to demonstrate improved overall function.   Baseline 38% (7/31)   Time 8   Period Weeks   Status New               Plan - 06/12/16 1241    Clinical Impression Statement Pt demonstrates continued R shoulder hiking during standing/seated flexion/abduction. AROM improves with scapular stabilization in standing. Pt reports tightness at end range PROM which gradually improves throughout session. Pt will continue to benefit from skilled PT services to address deficits in R shoulder weakness/pain.    Rehab Potential Good   Clinical Impairments Affecting Rehab Potential age, complexity of surgery   PT Frequency 2x / week   PT Duration 12 weeks   PT Treatment/Interventions ADLs/Self Care Home Management;Aquatic Therapy;Cryotherapy;Electrical Stimulation;Moist Heat;Ultrasound;Patient/family education;Therapeutic exercise;Therapeutic activities;Functional mobility training;Manual techniques;Dry needling;Passive range of motion;Taping   PT Next Visit Plan Continue with R shoulder PROM/AAROM, manual therapy, minimize shoulder hiking, periscapular strengthening,    PT Home Exercise Plan As prescribed   Consulted and Agree with Plan of Care Patient      Patient will benefit from skilled therapeutic intervention in order to improve the following deficits and impairments:  Decreased activity tolerance, Decreased endurance, Decreased range of motion, Decreased safety awareness, Decreased strength, Hypomobility, Impaired UE functional use, Pain,  Impaired sensation  Visit Diagnosis: Pain in right shoulder  Abnormal posture     Problem List Patient Active Problem List   Diagnosis Date Noted  . Full thickness rotator cuff tear 03/09/2016   Lynnea MaizesJason D Huprich PT, DPT   Huprich,Jason 06/12/2016, 12:47 PM  [place] [facility] [address] [place], [place], [address] Phone: [phone]   Fax:  [phone]  Name: Veronica Olsen MRN: [id number] Date of Birth: 08/02/1941

## 2016-06-14 ENCOUNTER — Ambulatory Visit: Payer: PPO

## 2016-06-14 DIAGNOSIS — R293 Abnormal posture: Secondary | ICD-10-CM

## 2016-06-14 DIAGNOSIS — M25511 Pain in right shoulder: Secondary | ICD-10-CM

## 2016-06-14 NOTE — Therapy (Signed)
[place] [facility] [address] [place], [place], [address] Phone: [phone]   Fax:  [phone]  Physical Therapy Treatment  Patient Details  Name: Veronica Olsen MRN: [id number] Date of Birth: 08/25/1941 No Data Recorded  Encounter Date: 06/14/2016      PT End of Session - 06/14/16 1120    Visit Number 26   Number of Visits 33   Date for PT Re-Evaluation 06/19/16   Authorization Type 8/10   PT Start Time 1117   PT Stop Time 1200   PT Time Calculation (min) 43 min   Activity Tolerance Patient tolerated treatment well   Behavior During Therapy [facility] for tasks assessed/performed      History reviewed. No pertinent past medical history.  Past Surgical History:  Procedure Laterality Date  . APPENDECTOMY     AGE 75  . SHOULDER ARTHROSCOPY WITH OPEN ROTATOR CUFF REPAIR AND DISTAL CLAVICLE ACROMINECTOMY Right 03/09/2016   Procedure: SHOULDER ARTHROSCOPY WITH OPEN ROTATOR CUFF REPAIR AND DISTAL CLAVICLE ACROMINECTOMY;  Surgeon: Juanell FairlyKevin Krasinski, MD;  Location: [facility] ORS;  Service: Orthopedics;  Laterality: Right;    There were no vitals filed for this visit.      Subjective Assessment - 06/14/16 1118    Subjective Pt states she is doing well on this date. She reports some anterior shoulder soreness but no pain. Pt reports some increased soreness after last therapy treatment. HEP going well without questions or concerns. NCV test is scheduled for 07/09/16.    Diagnostic tests MRI   Patient Stated Goals Pt reports she would like to raise her arm overhead   Currently in Pain? Yes   Pain Score 2    Pain Location Shoulder   Pain Orientation Right   Pain Descriptors / Indicators Sore   Pain Type Other (Comment)   Pain Onset More than a month ago   Pain Frequency Intermittent   Multiple Pain Sites No           TREATMENT   Manual:  PROM R shoulder flexion, abd, IR/ER x 3 each direction with 5-10 second end range  hold , pt demonstrated improved PROM throughout, slight tightness and mild pain at end range throughout ; R shoulder posterior-inferior mobilizations at end range scaption grade 2-3 30 seconds x 3 bouts; R shoulder grade 2-3 AP mobilization in supine 30 seconds x 3 in 80 deg abduction and end range ER; R shoulder grade 2-3 inferior mobilization in supine 30 sec x3 in 90 deg abduction. Canes for flexion x 2 minutes; STM to R anterior/middle deltoid, long head bicep tendon; L sidelying R shoulder flexion MWM for upward scapular rotation x 10 repetitions;  Therex:  Sidelying R shoulder ER with 3# dumbell, 2x10, ; min verbal cues for proper technique  Sidelying R shoulder abduction w/ 1# dumbell, x10; "very little" pain.  Min tactile cues for shoulder depression during movement which improved quality of movement and decreased UT compensation throughout  Supine serratus punches x 10 with manual resistance; Seated bent elbow flexion with scapular setting x 10, considerable improvement in flexion AROM with scapular setting.   Min/mod verbal and tactile cues for form and technique during exercises. Mild increase in pain at end range.                       PT Education - 06/14/16 1120    Education provided Yes   Education Details HEP reinforced    Person(s) Educated  Patient   Methods Explanation   Comprehension Verbalized understanding             PT Long Term Goals - 05/22/16 1204      PT LONG TERM GOAL #1   Title pt will demonstrate at least 140 deg R shoulder PROM to return to funciton   Baseline 148 (05/22/16)   Time 4   Period Weeks   Status Achieved     PT LONG TERM GOAL #2   Title pt will be independent and compliant with HEP   Time 8   Period Weeks   Status Achieved     PT LONG TERM GOAL #3   Title pt will demonstrate 140 deg of R shoulder AROM to return to ADLs   Baseline 90 flex, 74 abd, 47 IR, 38 ER   Time 12   Period Weeks   Status On-going      PT LONG TERM GOAL #4   Title pt will be independent and compliant with progressive strengthening program to return to PLOF   Time 12   Period Weeks   Status On-going     PT LONG TERM GOAL #5   Title pt will be able to improve shoulder IR to 45 deg to be able to fasten her bra   Time 12   Period Weeks   Status On-going     Additional Long Term Goals   Additional Long Term Goals Yes     PT LONG TERM GOAL #6   Title pt will have improved QuickDASH score by >8 points to demonstrate improved overall function.   Baseline 38% (7/31)   Time 8   Period Weeks   Status New               Plan - 06/14/16 1120    Clinical Impression Statement Pt demonstrates considerable improvement in R shoulder flexion with scapular setting and bent elbow. She reports increased soreness upon arrival today but improves at end of session. Pt encouraged to continue HEP and follow-up as scheduled.    Rehab Potential Good   Clinical Impairments Affecting Rehab Potential age, complexity of surgery   PT Frequency 2x / week   PT Duration 12 weeks   PT Treatment/Interventions ADLs/Self Care Home Management;Aquatic Therapy;Cryotherapy;Electrical Stimulation;Moist Heat;Ultrasound;Patient/family education;Therapeutic exercise;Therapeutic activities;Functional mobility training;Manual techniques;Dry needling;Passive range of motion;Taping   PT Next Visit Plan Continue with R shoulder PROM/AAROM, manual therapy, minimize shoulder hiking, periscapular strengthening,    PT Home Exercise Plan As prescribed   Consulted and Agree with Plan of Care Patient      Patient will benefit from skilled therapeutic intervention in order to improve the following deficits and impairments:  Decreased activity tolerance, Decreased endurance, Decreased range of motion, Decreased safety awareness, Decreased strength, Hypomobility, Impaired UE functional use, Pain, Impaired sensation  Visit Diagnosis: Pain in right  shoulder  Abnormal posture     Problem List Patient Active Problem List   Diagnosis Date Noted  . Full thickness rotator cuff tear 03/09/2016   Lynnea MaizesJason D Sashia Campas PT, DPT   Jacky Hartung 06/14/2016, 1:04 PM  [place] [facility] [address] [place], [place], [address] Phone: [phone]   Fax:  [phone]  Name: Veronica Olsen MRN: [id number] Date of Birth: 07/12/1941

## 2016-06-18 DIAGNOSIS — M25511 Pain in right shoulder: Secondary | ICD-10-CM | POA: Diagnosis not present

## 2016-06-19 ENCOUNTER — Ambulatory Visit: Payer: PPO

## 2016-06-19 DIAGNOSIS — M25511 Pain in right shoulder: Secondary | ICD-10-CM | POA: Diagnosis not present

## 2016-06-19 DIAGNOSIS — R293 Abnormal posture: Secondary | ICD-10-CM

## 2016-06-19 NOTE — Therapy (Signed)
[place] [facility] MAIN [address] [facility] SERVICES [address] [place], [place], [address] Phone: [phone]   Fax:  [phone]  Physical Therapy Treatment  Patient Details  Name: Veronica Olsen MRN: [id number] Date of Birth: 1941/10/16 No Data Recorded  Encounter Date: 06/19/2016      PT End of Session - 06/19/16 1122    Visit Number 27   Number of Visits 49   Date for PT Re-Evaluation 08/14/16   Authorization Type 1/10   PT Start Time 1116   PT Stop Time 1202   PT Time Calculation (min) 46 min   Activity Tolerance Patient tolerated treatment well   Behavior During Therapy [facility] for tasks assessed/performed      History reviewed. No pertinent past medical history.  Past Surgical History:  Procedure Laterality Date  . APPENDECTOMY     AGE 28  . SHOULDER ARTHROSCOPY WITH OPEN ROTATOR CUFF REPAIR AND DISTAL CLAVICLE ACROMINECTOMY Right 03/09/2016   Procedure: SHOULDER ARTHROSCOPY WITH OPEN ROTATOR CUFF REPAIR AND DISTAL CLAVICLE ACROMINECTOMY;  Surgeon: Juanell Fairly, MD;  Location: [facility] ORS;  Service: Orthopedics;  Laterality: Right;    There were no vitals filed for this visit.      Subjective Assessment - 06/19/16 1102    Subjective Pt reports some increased R shoulder soreness on this date. She denies any increase in shoulder pain following last therapy session. Reports increased pain with movement this morning. States she has started wearing a bra again and was wondering if the strap could be causing her pain. She is fastening her bra in front and then turning it around so she doesn't have to fasten in the back. HEP is going well without questions or concerns.    Diagnostic tests MRI   Patient Stated Goals Pt reports she would like to raise her arm overhead   Currently in Pain? Yes   Pain Score 4    Pain Location Shoulder   Pain Orientation Right   Pain Descriptors / Indicators Sore   Pain Type Chronic pain   Pain Onset More than a month  ago   Pain Frequency Intermittent   Multiple Pain Sites No            [facility] PT Assessment - 06/19/16 1124      Observation/Other Assessments   Other Surveys  Other Surveys   Quick DASH  23.73%     ROM / Strength   AROM / PROM / Strength AROM;Strength;PROM     AROM   AROM Assessment Site Shoulder   Right/Left Shoulder Right   Right Shoulder Flexion 145 Degrees  supine, 106 in sitting   Right Shoulder ABduction 112 Degrees  supine, 70 sitting   Right Shoulder Internal Rotation 68 Degrees   Right Shoulder External Rotation 41 Degrees     PROM   PROM Assessment Site Shoulder   Right/Left Shoulder Right   Right Shoulder Flexion 145 Degrees   Right Shoulder ABduction 128 Degrees   Right Shoulder Internal Rotation 68 Degrees   Right Shoulder External Rotation 41 Degrees     Strength   Strength Assessment Site Shoulder   Right/Left Shoulder Right   Right Shoulder Flexion 3-/5   Right Shoulder ABduction 3-/5   Right Shoulder Internal Rotation 4/5  in sitting   Right Shoulder External Rotation 3+/5  in sitting      Therex:  R shoulder AROM/PROM and MMT completed with patient; Reviewed goals and plan of care with patient; PROM R shoulder flexion,  abd, IR/ER x 3 each direction with 5-10 second end range hold , pt demonstrated improved PROM throughout, slight tightness and mild pain at end range throughout ; Sidelying R shoulder ER with 3# dumbell, 2x10, ; min verbal cues for proper technique  Supine serratus punches x 10 with manual resistance; Standing bent elbow flexion with scapular setting x 10, considerable improvement in flexion AROM with scapular setting.  Standing bent elbow scaption with scapular setting x 10, considerable improvement in scaption AROM with scapular setting.  Min/mod verbal and tactile cues for form and technique during exercises. Mild increase in pain at end range.   Manual Therapy R shoulder grade 2-3AP mobilization in supine 30 seconds x 3  in 80 deg abduction and end range ER;                        PT Education - 06/19/16 1102    Education provided Yes   Education Details Goals updated, plan of care, HEP reinforced   Person(s) Educated Patient   Methods Explanation   Comprehension Verbalized understanding             PT Long Term Goals - 06/19/16 1113      PT LONG TERM GOAL #1   Title pt will demonstrate at least 140 deg R shoulder PROM to return to funciton   Baseline 148 (05/22/16),    Time 4   Period Weeks   Status Achieved     PT LONG TERM GOAL #2   Title pt will be independent and compliant with HEP   Time 8   Period Weeks   Status Achieved     PT LONG TERM GOAL #3   Title pt will demonstrate 140 deg of R shoulder flexion AROM to return to ADLs   Baseline 90 flex, 74 abd, 47 IR, 38 ER, 06/19/16: Achieved (145) in supine but limited to 106 flexion in sitting and 70 abduction   Time 12   Period Weeks   Status On-going     PT LONG TERM GOAL #4   Title pt will be independent and compliant with progressive strengthening program to return to PLOF   Time 12   Period Weeks   Status Achieved     PT LONG TERM GOAL #5   Title pt will be able to improve shoulder IR to 45 deg to be able to fasten her bra   Baseline 06/19/16: 68 degrees   Time 12   Period Weeks   Status Achieved     Additional Long Term Goals   Additional Long Term Goals Yes     PT LONG TERM GOAL #6   Title pt will have improved QuickDASH score by >8 points to demonstrate improved overall function.   Baseline 38% (7/31), 06/19/16: 22.73%   Time 8   Period Weeks   Status Achieved     PT LONG TERM GOAL #7   Title Pt will have improved QuickDASH score by >8 points to demonstrate improved overall function.   Baseline 06/19/16: 22.73%   Time 6   Period Weeks   Status New               Plan - 06/19/16 1112    Clinical Impression Statement Pt continues to demonstrate improvement in R shoulder strength and  range of motion. She remains primarily limited in PROM external rotation.  She also has difficulty with overhead flexion and scaption against gravity but is able to  complete with a shorter lever arm by bending her elbow. Her quick DASH score continues to decrease however she still reports significant disability at 22.73%. Pt encouraged to continue HEP.  She will continue to benefit from skilled PT services to address deficits in R shoulder weakness, stiffness, and pain in order to improve function with ADLs and household responsibilities.   Rehab Potential Good   Clinical Impairments Affecting Rehab Potential age, complexity of surgery   PT Frequency 2x / week   PT Duration 12 weeks   PT Treatment/Interventions ADLs/Self Care Home Management;Aquatic Therapy;Cryotherapy;Electrical Stimulation;Moist Heat;Ultrasound;Patient/family education;Therapeutic exercise;Therapeutic activities;Functional mobility training;Manual techniques;Dry needling;Passive range of motion;Taping   PT Next Visit Plan Continue with R shoulder PROM/AAROM, manual therapy, minimize shoulder hiking, periscapular strengthening,    PT Home Exercise Plan As prescribed   Consulted and Agree with Plan of Care Patient      Patient will benefit from skilled therapeutic intervention in order to improve the following deficits and impairments:  Decreased activity tolerance, Decreased endurance, Decreased range of motion, Decreased safety awareness, Decreased strength, Hypomobility, Impaired UE functional use, Pain, Impaired sensation  Visit Diagnosis: Pain in right shoulder - Plan: PT plan of care cert/re-cert  Abnormal posture - Plan: PT plan of care cert/re-cert       G-Codes - 06/19/16 1550    Functional Assessment Tool Used ROM, MMT, quickDASH, clinical judgement   Functional Limitation Carrying, moving and handling objects   Carrying, Moving and Handling Objects Current Status ([id number]) At least 20 percent but less than 40  percent impaired, limited or restricted   Carrying, Moving and Handling Objects Goal Status (U0454(G8985) At least 1 percent but less than 20 percent impaired, limited or restricted      Problem List Patient Active Problem List   Diagnosis Date Noted  . Full thickness rotator cuff tear 03/09/2016   Lynnea MaizesJason D Huprich PT, DPT   Huprich,Jason 06/19/2016, 3:54 PM  [place] [facility] MA[facility] [address] [place], [place], [address] Phone: [phone]   Fax:  [phone]  Name: Veronica Olsen MRN: [id number] Date of Birth: 12/10/1940

## 2016-06-21 ENCOUNTER — Ambulatory Visit: Payer: PPO | Admitting: Physical Therapy

## 2016-06-21 ENCOUNTER — Encounter: Payer: Self-pay | Admitting: Physical Therapy

## 2016-06-21 DIAGNOSIS — R293 Abnormal posture: Secondary | ICD-10-CM

## 2016-06-21 DIAGNOSIS — M25511 Pain in right shoulder: Secondary | ICD-10-CM | POA: Diagnosis not present

## 2016-06-21 NOTE — Therapy (Signed)
[place] [facility] [address] [place], [place], [address] Phone: [phone]   Fax:  [phone]  Physical Therapy Treatment  Patient Details  Name: Hilarie FredricksonMarjorie M Neuner MRN: [id number] Date of Birth: 05/29/1941 No Data Recorded  Encounter Date: 06/21/2016      PT End of Session - 06/21/16 1208    Visit Number 28   Number of Visits 49   Date for PT Re-Evaluation 08/14/16   Authorization Type 2/10   PT Start Time 1110   PT Stop Time 1158   PT Time Calculation (min) 48 min   Activity Tolerance Patient tolerated treatment well   Behavior During Therapy [facility] for tasks assessed/performed      History reviewed. No pertinent past medical history.  Past Surgical History:  Procedure Laterality Date  . APPENDECTOMY     AGE 10  . SHOULDER ARTHROSCOPY WITH OPEN ROTATOR CUFF REPAIR AND DISTAL CLAVICLE ACROMINECTOMY Right 03/09/2016   Procedure: SHOULDER ARTHROSCOPY WITH OPEN ROTATOR CUFF REPAIR AND DISTAL CLAVICLE ACROMINECTOMY;  Surgeon: Juanell FairlyKevin Krasinski, MD;  Location: [facility] ORS;  Service: Orthopedics;  Laterality: Right;    There were no vitals filed for this visit.      Subjective Assessment - 06/21/16 1112    Subjective Pt reports her Rt shoulder feels more sore when wearing a bra strap and has just been wearing a tanktop under her clothes instead with decreased soreness.  Does not have any complaints or questions about her HEP at this time.   Diagnostic tests MRI   Patient Stated Goals Pt reports she would like to raise her arm overhead   Currently in Pain? Yes   Pain Score 2    Pain Location Shoulder   Pain Orientation Right   Pain Descriptors / Indicators Aching   Pain Type Chronic pain   Pain Onset More than a month ago      TREATMENT  Therex:  PROM R shoulder flexion, abd, IR/ER x 10 each direction with 5 second end range hold, pt demonstrated improved PROM throughout, slight tightness and mild pain at end range  throughout  Sidelying R shoulder ER with 3# dumbell, 2x10, with improvement following cues to avoid wrist extension  Sidelying Rt shoulder flexion AROM 2x10 and with very light manual resistance 1x5  Supine serratus punches 3x10 with manual resistance  Standing bent elbow flexion with scapular setting x 10  Standing bent elbow scaption with scapular setting x 10  Scapular retraction 2x10 in sitting  R shoulder ER in sitting 1x10 AROM and 1x10 with light manual resistance  UE ranger into flexion/abduction  Min/mod verbal and tactile cues for form and technique during exercises. Mild increase in pain at end range.   Manual Therapy  R shoulder grade 2-3 AP mobilization in supine 30 seconds x 3 in 80 deg abduction and end range ER            PT Education - 06/21/16 1208    Education Details Exercise technique   Person(s) Educated Patient   Methods Explanation;Demonstration;Tactile cues;Verbal cues   Comprehension Verbalized understanding;Returned demonstration             PT Long Term Goals - 06/19/16 1113      PT LONG TERM GOAL #1   Title pt will demonstrate at least 140 deg R shoulder PROM to return to funciton   Baseline 148 (05/22/16),    Time 4   Period Weeks   Status Achieved  PT LONG TERM GOAL #2   Title pt will be independent and compliant with HEP   Time 8   Period Weeks   Status Achieved     PT LONG TERM GOAL #3   Title pt will demonstrate 140 deg of R shoulder flexion AROM to return to ADLs   Baseline 90 flex, 74 abd, 47 IR, 38 ER, 06/19/16: Achieved (145) in supine but limited to 106 flexion in sitting and 70 abduction   Time 12   Period Weeks   Status On-going     PT LONG TERM GOAL #4   Title pt will be independent and compliant with progressive strengthening program to return to PLOF   Time 12   Period Weeks   Status Achieved     PT LONG TERM GOAL #5   Title pt will be able to improve shoulder IR to 45 deg to be able to fasten her bra    Baseline 06/19/16: 68 degrees   Time 12   Period Weeks   Status Achieved     Additional Long Term Goals   Additional Long Term Goals Yes     PT LONG TERM GOAL #6   Title pt will have improved QuickDASH score by >8 points to demonstrate improved overall function.   Baseline 38% (7/31), 06/19/16: 22.73%   Time 8   Period Weeks   Status Achieved     PT LONG TERM GOAL #7   Title Pt will have improved QuickDASH score by >8 points to demonstrate improved overall function.   Baseline 06/19/16: 22.73%   Time 6   Period Weeks   Status New               Plan - 06/21/16 1209    Clinical Impression Statement Pt continues to demonstrate poor posture and scapular control with Rt UE exercises; however this is improving as pt is become more self aware and able to correct with cues.  Pt is making good progress and will continue with strengthening, ROM, and scapular control exercises.   Rehab Potential Good   Clinical Impairments Affecting Rehab Potential age, complexity of surgery   PT Frequency 2x / week   PT Duration 12 weeks   PT Treatment/Interventions ADLs/Self Care Home Management;Aquatic Therapy;Cryotherapy;Electrical Stimulation;Moist Heat;Ultrasound;Patient/family education;Therapeutic exercise;Therapeutic activities;Functional mobility training;Manual techniques;Dry needling;Passive range of motion;Taping   PT Next Visit Plan Continue with R shoulder PROM/AAROM, manual therapy, minimize shoulder hiking, periscapular strengthening,    PT Home Exercise Plan As prescribed   Consulted and Agree with Plan of Care Patient      Patient will benefit from skilled therapeutic intervention in order to improve the following deficits and impairments:  Decreased activity tolerance, Decreased endurance, Decreased range of motion, Decreased safety awareness, Decreased strength, Hypomobility, Impaired UE functional use, Pain, Impaired sensation  Visit Diagnosis: Pain in right shoulder  Abnormal  posture     Problem List Patient Active Problem List   Diagnosis Date Noted  . Full thickness rotator cuff tear 03/09/2016    Encarnacion Chu PT, DPT 06/21/2016, 12:12 PM  [place] [facility] MAIN [facility] SERVICES [address] [place], [place], [address] Phone: [phone]   Fax:  [phone]  Name: RAYYA YAGI MRN: [id number] Date of Birth: 07/28/1941

## 2016-06-28 ENCOUNTER — Ambulatory Visit: Payer: PPO | Attending: Orthopedic Surgery | Admitting: Physical Therapy

## 2016-06-28 ENCOUNTER — Encounter: Payer: Self-pay | Admitting: Physical Therapy

## 2016-06-28 DIAGNOSIS — M25511 Pain in right shoulder: Secondary | ICD-10-CM | POA: Diagnosis not present

## 2016-06-28 DIAGNOSIS — R293 Abnormal posture: Secondary | ICD-10-CM | POA: Diagnosis not present

## 2016-06-28 NOTE — Therapy (Signed)
[place] [facility] MAIN [facility] SERVICES [address] [place], [place], [address] Phone: [phone]   Fax:  [phone]  Physical Therapy Treatment  Patient Details  Name: Veronica Olsen MRN: [id number] Date of Birth: Apr 25, 1941 No Data Recorded  Encounter Date: 06/28/2016      PT End of Session - 06/28/16 1157    Visit Number 29   Number of Visits 49   Date for PT Re-Evaluation 08/14/16   Authorization Type 3/10   PT Start Time 1245   PT Stop Time 0125   PT Time Calculation (min) 760 min   Activity Tolerance Patient tolerated treatment well   Behavior During Therapy [facility] for tasks assessed/performed      History reviewed. No pertinent past medical history.  Past Surgical History:  Procedure Laterality Date  . APPENDECTOMY     AGE 47  . SHOULDER ARTHROSCOPY WITH OPEN ROTATOR CUFF REPAIR AND DISTAL CLAVICLE ACROMINECTOMY Right 03/09/2016   Procedure: SHOULDER ARTHROSCOPY WITH OPEN ROTATOR CUFF REPAIR AND DISTAL CLAVICLE ACROMINECTOMY;  Surgeon: Juanell Fairly, MD;  Location: [facility] ORS;  Service: Orthopedics;  Laterality: Right;    There were no vitals filed for this visit.      Subjective Assessment - 06/28/16 1155    Subjective Pt reports her Rt shoulder feels more sore when wearing a bra strap and has just been wearing a tanktop under her clothes instead with decreased soreness.  Does not have any complaints or questions about her HEP at this time.She is having minimal pain 1/10 today.   Diagnostic tests MRI   Patient Stated Goals Pt reports she would like to raise her arm overhead   Currently in Pain? Yes   Pain Score 1    Pain Location Shoulder   Pain Onset More than a month ago      THER-EX Supine R shoulder serratus punch 3# 2 x 10; Supine R shoulder circles, with shoulder ROM on wall in standing CW 2 x 10, CCW 2 x 10; Supine R shoulder rythmic stabilization at elbow, 30 and 45 degrees scaption, 30 sec/bout x 2 bouts; Supine  R shoulder D1 flexion and D2 extension RTB 2 x 10 each; Sitting  canes for flexion with 4 lbs 2 x 10; MATRIX PNF  D1,D2 2. 5 lbs  Finger ladder Ball around door frame x 15 Body blade x 30 sec x 3 30 deg, 45 deg 60 d standing with pillow under arm R shoulder ER, 3 lbs 2 x 10; Seated RTB rows 2 x 10; Standing RTB elbows straight x 2 x 10 Patient reports no increased pain during session and is able to perform all exercises today with cuing for correct position and speed.                            PT Education - 06/28/16 1156    Education provided Yes   Education Details exercise with correct posture   Person(s) Educated Patient   Methods Explanation   Comprehension Verbalized understanding             PT Long Term Goals - 06/19/16 1113      PT LONG TERM GOAL #1   Title pt will demonstrate at least 140 deg R shoulder PROM to return to funciton   Baseline 148 (05/22/16),    Time 4   Period Weeks   Status Achieved     PT LONG TERM GOAL #2  Title pt will be independent and compliant with HEP   Time 8   Period Weeks   Status Achieved     PT LONG TERM GOAL #3   Title pt will demonstrate 140 deg of R shoulder flexion AROM to return to ADLs   Baseline 90 flex, 74 abd, 47 IR, 38 ER, 06/19/16: Achieved (145) in supine but limited to 106 flexion in sitting and 70 abduction   Time 12   Period Weeks   Status On-going     PT LONG TERM GOAL #4   Title pt will be independent and compliant with progressive strengthening program to return to PLOF   Time 12   Period Weeks   Status Achieved     PT LONG TERM GOAL #5   Title pt will be able to improve shoulder IR to 45 deg to be able to fasten her bra   Baseline 06/19/16: 68 degrees   Time 12   Period Weeks   Status Achieved     Additional Long Term Goals   Additional Long Term Goals Yes     PT LONG TERM GOAL #6   Title pt will have improved QuickDASH score by >8 points to demonstrate improved overall  function.   Baseline 38% (7/31), 06/19/16: 22.73%   Time 8   Period Weeks   Status Achieved     PT LONG TERM GOAL #7   Title Pt will have improved QuickDASH score by >8 points to demonstrate improved overall function.   Baseline 06/19/16: 22.73%   Time 6   Period Weeks   Status New               Plan - 06/28/16 1158    Clinical Impression Statement Min cuing needed to maintain posture while performing strengthening tasks. Good form was demonstrated throughout exercises.   Rehab Potential Good   Clinical Impairments Affecting Rehab Potential age, complexity of surgery   PT Frequency 2x / week   PT Duration 12 weeks   PT Treatment/Interventions ADLs/Self Care Home Management;Aquatic Therapy;Cryotherapy;Electrical Stimulation;Moist Heat;Ultrasound;Patient/family education;Therapeutic exercise;Therapeutic activities;Functional mobility training;Manual techniques;Dry needling;Passive range of motion;Taping   PT Next Visit Plan Continue with R shoulder PROM/AAROM, manual therapy, minimize shoulder hiking, periscapular strengthening,    PT Home Exercise Plan As prescribed   Consulted and Agree with Plan of Care Patient      Patient will benefit from skilled therapeutic intervention in order to improve the following deficits and impairments:  Decreased activity tolerance, Decreased endurance, Decreased range of motion, Decreased safety awareness, Decreased strength, Hypomobility, Impaired UE functional use, Pain, Impaired sensation  Visit Diagnosis: Pain in right shoulder  Abnormal posture     Problem List Patient Active Problem List   Diagnosis Date Noted  . Full thickness rotator cuff tear 03/09/2016   Ezekiel InaKristine S [place], PT, DPT [place], Kristine S 06/28/2016, 11:59 AM  [place] [facility] [address] [place], [place], [address] Phone: [phone]   Fax:  [phone]  Name: Veronica Olsen MRN:  [id number] Date of Birth: 09/21/1941

## 2016-07-05 ENCOUNTER — Encounter: Payer: Self-pay | Admitting: Physical Therapy

## 2016-07-05 ENCOUNTER — Ambulatory Visit: Payer: PPO | Admitting: Physical Therapy

## 2016-07-05 VITALS — BP 137/73 | HR 82

## 2016-07-05 DIAGNOSIS — M25511 Pain in right shoulder: Secondary | ICD-10-CM | POA: Diagnosis not present

## 2016-07-05 DIAGNOSIS — R293 Abnormal posture: Secondary | ICD-10-CM

## 2016-07-05 NOTE — Therapy (Signed)
[facility] [address] [place], [place], [address] Phone: [phone]   Fax:  [phone]  Physical Therapy Treatment  Patient Details  Name: Veronica Olsen MRN: [id number] Date of Birth: 05/10/1941 No Data Recorded  Encounter Date: 07/05/2016      PT End of Session - 07/05/16 1117    Visit Number 30   Number of Visits 49   Date for PT Re-Evaluation 08/14/16   Authorization Type 4/10   PT Start Time 1030   PT Stop Time 1115   PT Time Calculation (min) 45 min   Activity Tolerance Patient tolerated treatment well   Behavior [facility] for tasks assessed/performed      History reviewed. No pertinent past medical history.  Past Surgical History:  Procedure Laterality Date  . APPENDECTOMY     AGE 75  . SHOULDER ARTHROSCOPY WITH OPEN ROTATOR CUFF REPAIR AND DISTAL CLAVICLE ACROMINECTOMY Right 03/09/2016   Procedure: SHOULDER ARTHROSCOPY WITH OPEN ROTATOR CUFF REPAIR AND DISTAL CLAVICLE ACROMINECTOMY;  Surgeon: Juanell FairlyKevin Krasinski, MD;  Location: [facility] ORS;  Service: Orthopedics;  Laterality: Right;    Vitals:   07/05/16 1032  BP: 137/73  Pulse: 82  SpO2: 95%        Subjective Assessment - 07/05/16 1030    Subjective Trialed wearing a bra for 3-4 days but her shoulder began aching at all times so she has been wearing tanktop instead.  Has been completing HEP every day but missed a few days as her brother in law passed away.  Pt reports she has had R thumb numbness since her surgery which has not improved.  Is having a nerve conduction test on 07/10/16.  No concerns or questions at this time.   Diagnostic tests MRI   Patient Stated Goals Pt reports she would like to raise her arm overhead   Currently in Pain? No/denies   Pain Onset More than a month ago      TREATMENT  Therex:  Supine serratus punches 3x10 with manual resistance  Middle and low scapular rows with RTB 3x10  High rows without theraband  as pt is unable to achieve correct posture with band, fatigues with this exercise. 3x10  Standing R shoulder flexion with RTB 3x8, cues for scapular setting prior to flexion  Standing R shoulder flexion in scapular plane with RTB 3x8, cues for scapular setting prior to flexion  Standing R shoulder IR at 90 deg elbow flexion with RTB, 3x10  Sitting bent elbow flexion with scapular setting 3x10   Manual Therapy  R shoulder grade 2-3 AP mobilization in supine 30 seconds x 3 in 80 deg abduction and end range ER                PT Education - 07/05/16 1033    Education provided Yes   Education Details Exercise technique, cues for posture and to depress shoulder with exercises   Person(s) Educated Patient   Methods Explanation;Demonstration;Tactile cues;Verbal cues   Comprehension Verbalized understanding;Returned demonstration             PT Long Term Goals - 06/19/16 1113      PT LONG TERM GOAL #1   Title pt will demonstrate at least 140 deg R shoulder PROM to return to funciton   Baseline 148 (05/22/16),    Time 4   Period Weeks   Status Achieved     PT LONG TERM GOAL #2   Title pt will be independent  and compliant with HEP   Time 8   Period Weeks   Status Achieved     PT LONG TERM GOAL #3   Title pt will demonstrate 140 deg of R shoulder flexion AROM to return to ADLs   Baseline 90 flex, 74 abd, 47 IR, 38 ER, 06/19/16: Achieved (145) in supine but limited to 106 flexion in sitting and 70 abduction   Time 12   Period Weeks   Status On-going     PT LONG TERM GOAL #4   Title pt will be independent and compliant with progressive strengthening program to return to PLOF   Time 12   Period Weeks   Status Achieved     PT LONG TERM GOAL #5   Title pt will be able to improve shoulder IR to 45 deg to be able to fasten her bra   Baseline 06/19/16: 68 degrees   Time 12   Period Weeks   Status Achieved     Additional Long Term Goals   Additional Long Term Goals Yes      PT LONG TERM GOAL #6   Title pt will have improved QuickDASH score by >8 points to demonstrate improved overall function.   Baseline 38% (7/31), 06/19/16: 22.73%   Time 8   Period Weeks   Status Achieved     PT LONG TERM GOAL #7   Title Pt will have improved QuickDASH score by >8 points to demonstrate improved overall function.   Baseline 06/19/16: 22.73%   Time 6   Period Weeks   Status New               Plan - 07/05/16 1040    Clinical Impression Statement Pt requires cues for posture and exercise technique, mainly to depress scapulae/shoulders during exercise.  She tolerated all interventions well this date.     Rehab Potential Good   Clinical Impairments Affecting Rehab Potential age, complexity of surgery   PT Frequency 2x / week   PT Duration 12 weeks   PT Treatment/Interventions ADLs/Self Care Home Management;Aquatic Therapy;Cryotherapy;Electrical Stimulation;Moist Heat;Ultrasound;Patient/family education;Therapeutic exercise;Therapeutic activities;Functional mobility training;Manual techniques;Dry needling;Passive range of motion;Taping   PT Next Visit Plan Review HEP, pt to bring in, progress as appropriate.  Ask about nerve conduction test LUE.  Continue with R shoulder PROM/AAROM, manual therapy, minimize shoulder hiking, periscapular strengthening,    PT Home Exercise Plan As prescribed   Consulted and Agree with Plan of Care Patient      Patient will benefit from skilled therapeutic intervention in order to improve the following deficits and impairments:  Decreased activity tolerance, Decreased endurance, Decreased range of motion, Decreased safety awareness, Decreased strength, Hypomobility, Impaired UE functional use, Pain, Impaired sensation  Visit Diagnosis: Pain in right shoulder  Abnormal posture     Problem List Patient Active Problem List   Diagnosis Date Noted  . Full thickness rotator cuff tear 03/09/2016    Encarnacion Chu PT,  DPT 07/05/2016, 11:19 AM  [place] [facility] MAIN [facility] SERVICES [address] [place], [place], [address] Phone: [phone]   Fax:  [phone]  Name: Veronica Olsen MRN: [id number] Date of Birth: 25-May-1941

## 2016-07-07 ENCOUNTER — Encounter: Payer: Self-pay | Admitting: Physical Therapy

## 2016-07-07 ENCOUNTER — Ambulatory Visit: Payer: PPO | Admitting: Physical Therapy

## 2016-07-07 VITALS — BP 126/67 | HR 71

## 2016-07-07 DIAGNOSIS — R293 Abnormal posture: Secondary | ICD-10-CM

## 2016-07-07 DIAGNOSIS — M25511 Pain in right shoulder: Secondary | ICD-10-CM

## 2016-07-07 NOTE — Therapy (Signed)
[place] [facility] MAIN [facility] SERVICES [address] [place], [place], [address] Phone: [phone]   Fax:  [phone]  Physical Therapy Treatment  Patient Details  Name: Veronica Olsen MRN: [id number] Date of Birth: 1941/03/29 No Data Recorded  Encounter Date: 07/07/2016      PT End of Session - 07/07/16 0917    Visit Number 31   Number of Visits 49   Date for PT Re-Evaluation 08/14/16   Authorization Type 5/10   PT Start Time 0835   PT Stop Time 0920   PT Time Calculation (min) 45 min   Activity Tolerance Patient tolerated treatment well   Behavior During Therapy [facility] for tasks assessed/performed      History reviewed. No pertinent past medical history.  Past Surgical History:  Procedure Laterality Date  . APPENDECTOMY     AGE 31  . SHOULDER ARTHROSCOPY WITH OPEN ROTATOR CUFF REPAIR AND DISTAL CLAVICLE ACROMINECTOMY Right 03/09/2016   Procedure: SHOULDER ARTHROSCOPY WITH OPEN ROTATOR CUFF REPAIR AND DISTAL CLAVICLE ACROMINECTOMY;  Surgeon: Juanell Fairly, MD;  Location: [facility] ORS;  Service: Orthopedics;  Laterality: Right;    Vitals:   07/07/16 0840  BP: 126/67  Pulse: 71  SpO2: 95%        Subjective Assessment - 07/07/16 0844    Subjective No complaints or concerns today.  Pt has been completing her HEP each day and has brought it in today to review.     Diagnostic tests MRI   Patient Stated Goals Pt reports she would like to raise her arm overhead   Currently in Pain? No/denies      TREATMENT Therapeutic Exercise:  High rows without theraband as pt is unable to achieve correct posture with band, fatigues with this exercise. 3x10  Standing R shoulder flexion with RTB 2x10, cues for scapular setting prior to flexion  Standing R shoulder flexion in scapular plane with RTB 2x10, cues for scapular setting prior to flexion  Seated R shoulder IR with R shoulder in 80 deg abduction resting on treatment table with RTB, 3x10   Seated bent elbow flexion with scapular setting 3x10  Standing forward punch with RTB, 3x10  Dynamic hug using MATRIX machine with 2.5lb Bil, 3x10  Push up plus on wall, 3x10   Manual Therapy  R shoulder grade 2-3 AP mobilization in supine 30 seconds x 3 in 80 deg abduction and end range ER followed by R shoulder AROM x2 in F/IR/ER with light OP at end range                PT Education - 07/07/16 0845    Education provided Yes   Education Details Exercise technique; reviewed HEP; cues for posture    Person(s) Educated Patient   Methods Explanation;Demonstration;Tactile cues;Verbal cues   Comprehension Verbalized understanding;Returned demonstration             PT Long Term Goals - 06/19/16 1113      PT LONG TERM GOAL #1   Title pt will demonstrate at least 140 deg R shoulder PROM to return to funciton   Baseline 148 (05/22/16),    Time 4   Period Weeks   Status Achieved     PT LONG TERM GOAL #2   Title pt will be independent and compliant with HEP   Time 8   Period Weeks   Status Achieved     PT LONG TERM GOAL #3   Title pt will demonstrate 140 deg of R  shoulder flexion AROM to return to ADLs   Baseline 90 flex, 74 abd, 47 IR, 38 ER, 06/19/16: Achieved (145) in supine but limited to 106 flexion in sitting and 70 abduction   Time 12   Period Weeks   Status On-going     PT LONG TERM GOAL #4   Title pt will be independent and compliant with progressive strengthening program to return to PLOF   Time 12   Period Weeks   Status Achieved     PT LONG TERM GOAL #5   Title pt will be able to improve shoulder IR to 45 deg to be able to fasten her bra   Baseline 06/19/16: 68 degrees   Time 12   Period Weeks   Status Achieved     Additional Long Term Goals   Additional Long Term Goals Yes     PT LONG TERM GOAL #6   Title pt will have improved QuickDASH score by >8 points to demonstrate improved overall function.   Baseline 38% (7/31), 06/19/16: 22.73%   Time  8   Period Weeks   Status Achieved     PT LONG TERM GOAL #7   Title Pt will have improved QuickDASH score by >8 points to demonstrate improved overall function.   Baseline 06/19/16: 22.73%   Time 6   Period Weeks   Status New               Plan - 07/07/16 0839    Clinical Impression Statement Pt unable to maintain 90 deg R shoulder abduction in standing to complete IR exercise so completed in the seated position.  Pt tolerated all interventions well this date.  Reviewed HEP and added RTB to shoulder F and shoulder scaption exercises.  Pt will benefit from skilled PT services to continue strengthening and AROM exercises for improved functionality.   Rehab Potential Good   Clinical Impairments Affecting Rehab Potential age, complexity of surgery   PT Frequency 2x / week   PT Duration 12 weeks   PT Treatment/Interventions ADLs/Self Care Home Management;Aquatic Therapy;Cryotherapy;Electrical Stimulation;Moist Heat;Ultrasound;Patient/family education;Therapeutic exercise;Therapeutic activities;Functional mobility training;Manual techniques;Dry needling;Passive range of motion;Taping   PT Next Visit Plan Ask about nerve conduction test LUE.  Consider adding standing forward punch to HEP.  Continue with R shoulder AAROM, manual therapy, minimize shoulder hiking, periscapular strengthening,    PT Home Exercise Plan Standing R shoulder F and scaption with RTB; standing R shoulder abduction; Standing ER/IR with towel roll and RTB   Consulted and Agree with Plan of Care Patient      Patient will benefit from skilled therapeutic intervention in order to improve the following deficits and impairments:  Decreased activity tolerance, Decreased endurance, Decreased range of motion, Decreased safety awareness, Decreased strength, Hypomobility, Impaired UE functional use, Pain, Impaired sensation  Visit Diagnosis: Pain in right shoulder  Abnormal posture     Problem List Patient Active  Problem List   Diagnosis Date Noted  . Full thickness rotator cuff tear 03/09/2016    Encarnacion ChuAshley Abashian PT, DPT 07/07/2016, 9:23 AM  [place] [facility] [address] [place], [place], [address] Phone: [phone]   Fax:  [phone]  Name: Veronica Olsen MRN: [id number] Date of Birth: 03/17/1941

## 2016-07-10 DIAGNOSIS — G5601 Carpal tunnel syndrome, right upper limb: Secondary | ICD-10-CM | POA: Diagnosis not present

## 2016-07-10 DIAGNOSIS — M75121 Complete rotator cuff tear or rupture of right shoulder, not specified as traumatic: Secondary | ICD-10-CM | POA: Diagnosis not present

## 2016-07-11 ENCOUNTER — Encounter: Payer: Self-pay | Admitting: Physical Therapy

## 2016-07-11 ENCOUNTER — Ambulatory Visit: Payer: PPO | Admitting: Physical Therapy

## 2016-07-11 DIAGNOSIS — M25511 Pain in right shoulder: Secondary | ICD-10-CM

## 2016-07-11 DIAGNOSIS — R293 Abnormal posture: Secondary | ICD-10-CM

## 2016-07-11 NOTE — Therapy (Signed)
[facility] MAIN [facility] [address] [place], [place], [address] Phone: [phone]   Fax:  [phone]  Physical Therapy Treatment  Patient Details  Name: Veronica Olsen MRN: [id number] Date of Birth: 08/21/1941 No Data Recorded  Encounter Date: 07/11/2016      PT End of Session - 07/11/16 1017    Visit Number 32   Number of Visits 49   Date for PT Re-Evaluation 08/14/16   Authorization Type 6/10   PT Start Time 0931   PT Stop Time 1015   PT Time Calculation (min) 44 min   Activity Tolerance Patient tolerated treatment well   Behavior During Therapy [facility] for tasks assessed/performed      History reviewed. No pertinent past medical history.  Past Surgical History:  Procedure Laterality Date  . APPENDECTOMY     AGE 21  . SHOULDER ARTHROSCOPY WITH OPEN ROTATOR CUFF REPAIR AND DISTAL CLAVICLE ACROMINECTOMY Right 03/09/2016   Procedure: SHOULDER ARTHROSCOPY WITH OPEN ROTATOR CUFF REPAIR AND DISTAL CLAVICLE ACROMINECTOMY;  Surgeon: Juanell FairlyKevin Krasinski, MD;  Location: [facility] ORS;  Service: Orthopedics;  Laterality: Right;    There were no vitals filed for this visit.      Subjective Assessment - 07/11/16 0932    Subjective Patient reports increased "grabbing" with moving.  Patient states she is doing the exercises she was told to focus on. She notes that the MD stated things were going well and to continue with therapy. NVC revealed the beginning or carpal tunnel syndrome.    Diagnostic tests MRI   Patient Stated Goals Pt reports she would like to raise her arm overhead   Currently in Pain? Yes   Pain Score 0-No pain   Pain Descriptors / Indicators Squeezing     Treatment:  Manual: Grade II AP joint mobs to R GH joint, x2 minutes to decrease shoulder tightness before strengthening exercises, shoulder in 50 degrees abduction and very little ER; patient has decrease shoulder tightness after mobilization. PROM of R GH joint into  shoulder flexion, scaption, ER and IR; x10 each direction, 2-3 second holds at end range; multiple min VCs required for patient to relax STM to R proximal biceps tendon, ~3-4 minutes, patient has mild decrease in muscle tightness and decreased tenderness to touch.  TherEx (given rest break in between each set for ~1-2 min): Sidelying ER with towel into mild shoulder ABD, 3# weight x1, patient notes pulling, decreased to 2# pulling subsides, 2x10, min VCs for initial positioning Seated scapular rows, no resistance, 3x10, min VCs to decrease upper trap activation  Seated R shoulder IR with shoulder positioned in 70 degrees ABD resting on treatment table, red tband resistance, 2x10 Seated bent elbow flexion and scaption, 2x10 each, min VCs to decrease upper trap activation and perform scapular setting Dynamic hug using MATRIX machine with 2.5# BUE, 3x10 Push up plus on wall, 3x10, min VCs for initial set up and decreased elbow flexion.   Instructed patient to increase rest breaks during exercises at PT and during HEP.                              PT Education - 07/11/16 1017    Education provided Yes   Education Details Taking longer rest breaks duirng HEP, posture   Person(s) Educated Patient   Methods Explanation   Comprehension Verbalized understanding             PT  Long Term Goals - 06/19/16 1113      PT LONG TERM GOAL #1   Title pt will demonstrate at least 140 deg R shoulder PROM to return to funciton   Baseline 148 (05/22/16),    Time 4   Period Weeks   Status Achieved     PT LONG TERM GOAL #2   Title pt will be independent and compliant with HEP   Time 8   Period Weeks   Status Achieved     PT LONG TERM GOAL #3   Title pt will demonstrate 140 deg of R shoulder flexion AROM to return to ADLs   Baseline 90 flex, 74 abd, 47 IR, 38 ER, 06/19/16: Achieved (145) in supine but limited to 106 flexion in sitting and 70 abduction   Time 12   Period  Weeks   Status On-going     PT LONG TERM GOAL #4   Title pt will be independent and compliant with progressive strengthening program to return to PLOF   Time 12   Period Weeks   Status Achieved     PT LONG TERM GOAL #5   Title pt will be able to improve shoulder IR to 45 deg to be able to fasten her bra   Baseline 06/19/16: 68 degrees   Time 12   Period Weeks   Status Achieved     Additional Long Term Goals   Additional Long Term Goals Yes     PT LONG TERM GOAL #6   Title pt will have improved QuickDASH score by >8 points to demonstrate improved overall function.   Baseline 38% (7/31), 06/19/16: 22.73%   Time 8   Period Weeks   Status Achieved     PT LONG TERM GOAL #7   Title Pt will have improved QuickDASH score by >8 points to demonstrate improved overall function.   Baseline 06/19/16: 22.73%   Time 6   Period Weeks   Status New               Plan - 07/11/16 1018    Clinical Impression Statement Pt reports mild "grabbing" with movement with mild fatigue. Patient instructed to rest more in between exercises and during HEP. Patient continues to tolerate interventions well with mild pulling during ER which decreased with less weight. Patient denies an increase in pain although she does state it feels tired. Patient would continue to benefit from skilled PT in order to address AROM, strength, and improve her functional mobility.    Rehab Potential Good   Clinical Impairments Affecting Rehab Potential age, complexity of surgery   PT Frequency 2x / week   PT Duration 12 weeks   PT Treatment/Interventions ADLs/Self Care Home Management;Aquatic Therapy;Cryotherapy;Electrical Stimulation;Moist Heat;Ultrasound;Patient/family education;Therapeutic exercise;Therapeutic activities;Functional mobility training;Manual techniques;Dry needling;Passive range of motion;Taping   PT Next Visit Plan Ask about nerve conduction test LUE.  Consider adding standing forward punch to HEP.   Continue with R shoulder AAROM, manual therapy, minimize shoulder hiking, periscapular strengthening,    PT Home Exercise Plan Standing R shoulder F and scaption with RTB; standing R shoulder abduction; Standing ER/IR with towel roll and RTB   Consulted and Agree with Plan of Care Patient      Patient will benefit from skilled therapeutic intervention in order to improve the following deficits and impairments:  Decreased activity tolerance, Decreased endurance, Decreased range of motion, Decreased safety awareness, Decreased strength, Hypomobility, Impaired UE functional use, Pain, Impaired sensation  Visit Diagnosis: Pain in right  shoulder  Abnormal posture     Problem List Patient Active Problem List   Diagnosis Date Noted  . Full thickness rotator cuff tear 03/09/2016   Trula Ore, SPT This entire session was performed under direct supervision and direction of a licensed therapist/therapist assistant . I have personally read, edited and approve of the note as written.  Trotter,Margaret PT, DPT 07/11/2016, 3:18 PM  [facility] MAIN [facility] SERVICES [address] [place], [place], [address] Phone: [phone]   Fax:  [phone]  Name: Veronica Olsen MRN: [id number] Date of Birth: 08-07-1941

## 2016-07-13 ENCOUNTER — Encounter: Payer: Self-pay | Admitting: Physical Therapy

## 2016-07-13 ENCOUNTER — Ambulatory Visit: Payer: PPO | Admitting: Physical Therapy

## 2016-07-13 DIAGNOSIS — M25511 Pain in right shoulder: Secondary | ICD-10-CM | POA: Diagnosis not present

## 2016-07-13 DIAGNOSIS — R293 Abnormal posture: Secondary | ICD-10-CM

## 2016-07-13 NOTE — Therapy (Signed)
[facility] MAIN [facility] SERVICES [address] [place], [place], [address] Phone: [phone]   Fax:  [phone]  Physical Therapy Treatment  Patient Details  Name: Veronica Olsen MRN: [id number] Date of Birth: 1941-07-30 No Data Recorded  Encounter Date: 07/13/2016      PT End of Session - 07/13/16 1053    Visit Number 33   Number of Visits 49   Date for PT Re-Evaluation 08/14/16   Authorization Type 7/10   PT Start Time 0925   PT Stop Time 1010   PT Time Calculation (min) 45 min   Activity Tolerance Patient tolerated treatment well   Behavior During Therapy [facility] for tasks assessed/performed      History reviewed. No pertinent past medical history.  Past Surgical History:  Procedure Laterality Date  . APPENDECTOMY     AGE 75  . SHOULDER ARTHROSCOPY WITH OPEN ROTATOR CUFF REPAIR AND DISTAL CLAVICLE ACROMINECTOMY Right 03/09/2016   Procedure: SHOULDER ARTHROSCOPY WITH OPEN ROTATOR CUFF REPAIR AND DISTAL CLAVICLE ACROMINECTOMY;  Surgeon: Juanell Fairly, MD;  Location: [facility] ORS;  Service: Orthopedics;  Laterality: Right;    There were no vitals filed for this visit.      Subjective Assessment - 07/13/16 0926    Subjective Patient reports shoulder being slightly irritable but not painful. She states HEP is going well with no questions or concerns. She states she has days where the shoulder will get really achey, and that's when she will take ibuprofen.    Diagnostic tests MRI   Patient Stated Goals Pt reports she would like to raise her arm overhead   Currently in Pain? No/denies      Treatment:  Manual Grade II AP joint mobs to R GH joint, x2 minutes to decrease shoulder tightness before strengthening exercises, shoulder in 50 degrees abduction and very little ER. STM to R proximal biceps tendon and ant deltoid, ~5 minutes, patient has mild decrease in muscle tightness and has less tenderness as compared to earlier this  week.  TherEx (RUE only unless noted): AAROM (attempted PROM for warm up, patient assisted even with mod VCs to relax) of R GH joint into shoulder flexion, scaption, abduction, ER and IR; x10 each direction; Standing forward punch with yellow tband resistance, 3x10, min VCs to decrease upper trap activation. Standing B ER and scapular retraction with yellow tband resistance, towel under R arm for mild ABD, 3x10, min VCs to decrease upper trap activation and increase scapular retraction. Seated R shoulder IR with shoulder positioned in ~70 degrees ABD resting on treatment table, red tband resistance, 2x10, min VCs to control eccentric movement smoothly and maintain appropriate posture. Seated retractions, 2x10 no resistance, patient noted very easy, x10 with green tband, min VCs to decrease upper trap activation. Standing flexion and scapular movement with bent elbow movement, x10 each, min VCs to maintain posture and decrease UT compensation.   Dynamic stabilizations with small ball on the wall, 2x15 perturbations, 2 finger resistance, min VCs to maintain stability. Push up plus on wall; 2x10, min VCs for appropriate technique and decreased elbow flexion (initiated into HEP; handout given)                              PT Education - 07/13/16 1052    Education provided Yes   Education Details progressed HEP, continuing HEP, body positioning   Person(s) Educated Patient   Methods Explanation;Demonstration;Handout;Verbal cues  Comprehension Verbalized understanding;Returned demonstration;Verbal cues required             PT Long Term Goals - 06/19/16 1113      PT LONG TERM GOAL #1   Title pt will demonstrate at least 140 deg R shoulder PROM to return to funciton   Baseline 148 (05/22/16),    Time 4   Period Weeks   Status Achieved     PT LONG TERM GOAL #2   Title pt will be independent and compliant with HEP   Time 8   Period Weeks   Status Achieved      PT LONG TERM GOAL #3   Title pt will demonstrate 140 deg of R shoulder flexion AROM to return to ADLs   Baseline 90 flex, 74 abd, 47 IR, 38 ER, 06/19/16: Achieved (145) in supine but limited to 106 flexion in sitting and 70 abduction   Time 12   Period Weeks   Status On-going     PT LONG TERM GOAL #4   Title pt will be independent and compliant with progressive strengthening program to return to PLOF   Time 12   Period Weeks   Status Achieved     PT LONG TERM GOAL #5   Title pt will be able to improve shoulder IR to 45 deg to be able to fasten her bra   Baseline 06/19/16: 68 degrees   Time 12   Period Weeks   Status Achieved     Additional Long Term Goals   Additional Long Term Goals Yes     PT LONG TERM GOAL #6   Title pt will have improved QuickDASH score by >8 points to demonstrate improved overall function.   Baseline 38% (7/31), 06/19/16: 22.73%   Time 8   Period Weeks   Status Achieved     PT LONG TERM GOAL #7   Title Pt will have improved QuickDASH score by >8 points to demonstrate improved overall function.   Baseline 06/19/16: 22.73%   Time 6   Period Weeks   Status New               Plan - 07/13/16 1053    Clinical Impression Statement Pt is able to participate in PT today with no increase in pain and mildly less fatigue. The patient notes her HEP is going well. She requires demonstration and min VCs for improve progressed HEP of push up plus on wall.  She was able to perform exercises with min VCs for appropriate body positioning/posture and to control movements eccentrically. Pt would continue to benefit from skilled PT in order to address shoulder weakness, increase AROM, and improve her ability to perform ADLs.    Rehab Potential Good   Clinical Impairments Affecting Rehab Potential age, complexity of surgery   PT Frequency 2x / week   PT Duration 12 weeks   PT Treatment/Interventions ADLs/Self Care Home Management;Aquatic Therapy;Cryotherapy;Electrical  Stimulation;Moist Heat;Ultrasound;Patient/family education;Therapeutic exercise;Therapeutic activities;Functional mobility training;Manual techniques;Dry needling;Passive range of motion;Taping   PT Next Visit Plan Ask about nerve conduction test LUE.  Consider adding standing forward punch to HEP.  Continue with R shoulder AAROM, manual therapy, minimize shoulder hiking, periscapular strengthening,    PT Home Exercise Plan Standing R shoulder F and scaption with RTB; standing R shoulder abduction; Standing ER/IR with towel roll and RTB   Consulted and Agree with Plan of Care Patient      Patient will benefit from skilled therapeutic intervention in order to improve  the following deficits and impairments:  Decreased activity tolerance, Decreased endurance, Decreased range of motion, Decreased safety awareness, Decreased strength, Hypomobility, Impaired UE functional use, Pain, Impaired sensation  Visit Diagnosis: Pain in right shoulder  Abnormal posture     Problem List Patient Active Problem List   Diagnosis Date Noted  . Full thickness rotator cuff tear 03/09/2016   Trula Ore, SPT This entire session was performed under direct supervision and direction of a licensed therapist/therapist assistant . I have personally read, edited and approve of the note as written.  Trotter,Margaret PT, DPT 07/13/2016, 4:32 PM  [facility] MAIN [facility] SERVICES [address] [place], [place], [address] Phone: [phone]   Fax:  [phone]  Name: JILLIEN YAKEL MRN: [id number] Date of Birth: 1940-10-26

## 2016-07-13 NOTE — Patient Instructions (Addendum)
All Fours Shoulder Press-Up    On the wall, lock elbows and press up at shoulders, arching upper back. Return. Repeat __10__ times or for __3__ sets. Do __1__ sessions per day. Try not to bend the elbows   http://cc.exer.us/61   Copyright  VHI. All rights reserved.

## 2016-07-18 ENCOUNTER — Encounter: Payer: Self-pay | Admitting: Physical Therapy

## 2016-07-18 ENCOUNTER — Ambulatory Visit: Payer: PPO | Admitting: Physical Therapy

## 2016-07-18 DIAGNOSIS — M25511 Pain in right shoulder: Secondary | ICD-10-CM | POA: Diagnosis not present

## 2016-07-18 DIAGNOSIS — R293 Abnormal posture: Secondary | ICD-10-CM

## 2016-07-18 NOTE — Therapy (Signed)
[facility] MAIN [facility] SERVICES [address] [place], [place], [address] Phone: [phone]   Fax:  [phone]  Physical Therapy Treatment  Patient Details  Name: Veronica Olsen MRN: [id number] Date of Birth: 1941-04-22 No Data Recorded  Encounter Date: 07/18/2016      PT End of Session - 07/18/16 1053    Visit Number 34   Number of Visits 49   Date for PT Re-Evaluation 08/14/16   Authorization Type 8/10   PT Start Time 0930   PT Stop Time 1011   PT Time Calculation (min) 41 min   Activity Tolerance Patient tolerated treatment well   Behavior During Therapy [facility] for tasks assessed/performed      History reviewed. No pertinent past medical history.  Past Surgical History:  Procedure Laterality Date  . APPENDECTOMY     AGE 75  . SHOULDER ARTHROSCOPY WITH OPEN ROTATOR CUFF REPAIR AND DISTAL CLAVICLE ACROMINECTOMY Right 03/09/2016   Procedure: SHOULDER ARTHROSCOPY WITH OPEN ROTATOR CUFF REPAIR AND DISTAL CLAVICLE ACROMINECTOMY;  Surgeon: Juanell Fairly, MD;  Location: [facility] ORS;  Service: Orthopedics;  Laterality: Right;    There were no vitals filed for this visit.      Subjective Assessment - 07/18/16 1051    Subjective Pt notes she had a good weekend with no issues with her shoulder. She notes new exercise in HEP is going well with no concerns. She states she continues to have good/bad days.    Diagnostic tests MRI   Patient Stated Goals Pt reports she would like to raise her arm overhead   Currently in Pain? No/denies      Treatment: Manual:  R GH joint PROM into shoulder flexion, abduction, IR, ER x6 each direction.  Grade II AP shoulder mobs, ~1-2 min. In order to loosen shoulder before measurement.   Supine active R shoulder flexion: 150 degrees Supine passive R shoulder ABD: 100 degrees, pt has difficulty relaxing and notes very light pull. With observation, pt continues to have limited R shoulder ER but WNL for  IR.  TherEx: Standing forward flexion long arm x5 without weight, x5 with 1# weight Standing ER and scap retraction with red tband resistance, 3x5, min VCs to maintain appropriate posture and decrease shoulder shrug. Standing forward punches, yellow tband resistance (2x8) increased to red tband resistance (x8), min VCs for correct forearm positioning and to maintain equal posture between R/L.  Horizontal ADD with cable row, 2x10, min VCs for initial set up Dynamic stabilization with RUE on green ball, 3x10 pertubations proximal elbow (x2 sets) moving to distal elbow (x1 set); min VCs to maintain stability                          PT Education - 07/18/16 1052    Education provided Yes   Education Details continuing HEP, body positioning during exercises   Person(s) Educated Patient   Methods Explanation   Comprehension Verbalized understanding             PT Long Term Goals - 06/19/16 1113      PT LONG TERM GOAL #1   Title pt will demonstrate at least 140 deg R shoulder PROM to return to funciton   Baseline 148 (05/22/16),    Time 4   Period Weeks   Status Achieved     PT LONG TERM GOAL #2   Title pt will be independent and compliant with HEP   Time 8  Period Weeks   Status Achieved     PT LONG TERM GOAL #3   Title pt will demonstrate 140 deg of R shoulder flexion AROM to return to ADLs   Baseline 90 flex, 74 abd, 47 IR, 38 ER, 06/19/16: Achieved (145) in supine but limited to 106 flexion in sitting and 70 abduction   Time 12   Period Weeks   Status On-going     PT LONG TERM GOAL #4   Title pt will be independent and compliant with progressive strengthening program to return to PLOF   Time 12   Period Weeks   Status Achieved     PT LONG TERM GOAL #5   Title pt will be able to improve shoulder IR to 45 deg to be able to fasten her bra   Baseline 06/19/16: 68 degrees   Time 12   Period Weeks   Status Achieved     Additional Long Term Goals    Additional Long Term Goals Yes     PT LONG TERM GOAL #6   Title pt will have improved QuickDASH score by >8 points to demonstrate improved overall function.   Baseline 38% (7/31), 06/19/16: 22.73%   Time 8   Period Weeks   Status Achieved     PT LONG TERM GOAL #7   Title Pt will have improved QuickDASH score by >8 points to demonstrate improved overall function.   Baseline 06/19/16: 22.73%   Time 6   Period Weeks   Status New               Plan - 07/18/16 1053    Clinical Impression Statement Pt is able to participate in measurement of AROM and strengthening exercises with no increase in pain today. She was able to tolerate mild weight or tband progression with no incidents or extreme fatigue. She requires min VCs for posturing and continued appropriate set up during exercises for improved form. She would continue to benefit from skilled PT in order to address shoulder weakness, increase AROM, and improve her ability to perform ADLs.    Rehab Potential Good   Clinical Impairments Affecting Rehab Potential age, complexity of surgery   PT Frequency 2x / week   PT Duration 12 weeks   PT Treatment/Interventions ADLs/Self Care Home Management;Aquatic Therapy;Cryotherapy;Electrical Stimulation;Moist Heat;Ultrasound;Patient/family education;Therapeutic exercise;Therapeutic activities;Functional mobility training;Manual techniques;Dry needling;Passive range of motion;Taping   PT Next Visit Plan Consider adding standing forward punch to HEP.  Continue with R shoulder AAROM, manual therapy, minimize shoulder hiking, periscapular strengthening,    PT Home Exercise Plan Standing R shoulder F and scaption with RTB; standing R shoulder abduction; Standing ER/IR with towel roll and RTB   Consulted and Agree with Plan of Care Patient      Patient will benefit from skilled therapeutic intervention in order to improve the following deficits and impairments:  Decreased activity tolerance, Decreased  endurance, Decreased range of motion, Decreased safety awareness, Decreased strength, Hypomobility, Impaired UE functional use, Pain, Impaired sensation  Visit Diagnosis: Pain in right shoulder  Abnormal posture     Problem List Patient Active Problem List   Diagnosis Date Noted  . Full thickness rotator cuff tear 03/09/2016   Trula Ore, SPT This entire session was performed under direct supervision and direction of a licensed therapist/therapist assistant . I have personally read, edited and approve of the note as written.  Trotter,Margaret PT, DPT 07/18/2016, 1:23 PM  [facility] MAIN [facility] SERVICES [address] [place], [place], [address] Phone: [phone]   Fax:  [phone]  Name: Veronica Olsen MRN: [id number] Date of Birth: 09/26/1941

## 2016-07-20 ENCOUNTER — Encounter: Payer: Self-pay | Admitting: Physical Therapy

## 2016-07-20 ENCOUNTER — Ambulatory Visit: Payer: PPO | Admitting: Physical Therapy

## 2016-07-20 DIAGNOSIS — R293 Abnormal posture: Secondary | ICD-10-CM

## 2016-07-20 DIAGNOSIS — M25511 Pain in right shoulder: Secondary | ICD-10-CM

## 2016-07-20 NOTE — Therapy (Signed)
[place] [facility] MAIN [facility] SERVICES [address] [place], [place], [address] Phone: [phone]   Fax:  [phone]  Physical Therapy Treatment  Patient Details  Name: Veronica Olsen MRN: [id number] Date of Birth: 12-27-1940 No Data Recorded  Encounter Date: 07/20/2016      PT End of Session - 07/20/16 1006    Visit Number 35   Number of Visits 49   Date for PT Re-Evaluation 08/14/16   Authorization Type 9/10   PT Start Time 0922   PT Stop Time 1006   PT Time Calculation (min) 44 min   Activity Tolerance Patient tolerated treatment well   Behavior During Therapy [facility] for tasks assessed/performed      History reviewed. No pertinent past medical history.  Past Surgical History:  Procedure Laterality Date  . APPENDECTOMY     AGE 73  . SHOULDER ARTHROSCOPY WITH OPEN ROTATOR CUFF REPAIR AND DISTAL CLAVICLE ACROMINECTOMY Right 03/09/2016   Procedure: SHOULDER ARTHROSCOPY WITH OPEN ROTATOR CUFF REPAIR AND DISTAL CLAVICLE ACROMINECTOMY;  Surgeon: Juanell Fairly, MD;  Location: [facility] ORS;  Service: Orthopedics;  Laterality: Right;    There were no vitals filed for this visit.      Subjective Assessment - 07/20/16 0924    Subjective Pt notes she has been feeling well and exercises are going well. She has no questions or concerns. Denies shoulder pain.    Diagnostic tests MRI   Patient Stated Goals Pt reports she would like to raise her arm overhead   Currently in Pain? No/denies     Treatment: Manual: R GH joint PROM into shoulder flexion, abduction, IR, ER x10 each direction. Grade II AP and inferior shoulder mobs to R GH joint, ~2 min each. In order to loosen shoulder for movement to follow  TherEx: Sidelying ABD with short lever arm and SPT CGA in order for patient to perform the exercise with appropriate technique, 2x5, min VCs for initial set up Standing forward flexion long arm x10 with 1# weight, x10 with yellow tband resistance  (initiated into HEP); scaption with long arm x10 with yellow tband resistance (initiated into HEP); decreased from red tband at home due to pt report of difficulty performing through range and poor ability to maintain quality of movement.    Min VCs to decrease R shoulder shrug during movements Standing ER and scap retraction with red tband resistance, towel at R distal elbow for support, 3x10, rest in between sets, min VCs to maintain appropriate posture and decrease shoulder shrug. Standing forward punches, red tband resistance 3x10, rest in between sets, min VCs for correct forearm positioning and to maintain this positioning throughout. Dynamic stabilization with RUE on green ball, 3x10 pertubations at middle of forearm; min VCs to maintain stability, patient notes increased difficulty With RUE supported into 60 degrees of ABD, IR with red tband resistance, 2x10, mild pull when instructed into more horizontal ABD, with repositioning of arm, patient denies pulling.  Patient denies pain throughout session and instructed to rest in between sets of exercises to allow R shoulder to rest.                             PT Education - 07/20/16 1008    Education provided Yes   Education Details changing HEP to yellow tband, body positioning during exercise   Person(s) Educated Patient   Methods Explanation   Comprehension Verbalized understanding  PT Long Term Goals - 06/19/16 1113      PT LONG TERM GOAL #1   Title pt will demonstrate at least 140 deg R shoulder PROM to return to funciton   Baseline 148 (05/22/16),    Time 4   Period Weeks   Status Achieved     PT LONG TERM GOAL #2   Title pt will be independent and compliant with HEP   Time 8   Period Weeks   Status Achieved     PT LONG TERM GOAL #3   Title pt will demonstrate 140 deg of R shoulder flexion AROM to return to ADLs   Baseline 90 flex, 74 abd, 47 IR, 38 ER, 06/19/16: Achieved (145) in  supine but limited to 106 flexion in sitting and 70 abduction   Time 12   Period Weeks   Status On-going     PT LONG TERM GOAL #4   Title pt will be independent and compliant with progressive strengthening program to return to PLOF   Time 12   Period Weeks   Status Achieved     PT LONG TERM GOAL #5   Title pt will be able to improve shoulder IR to 45 deg to be able to fasten her bra   Baseline 06/19/16: 68 degrees   Time 12   Period Weeks   Status Achieved     Additional Long Term Goals   Additional Long Term Goals Yes     PT LONG TERM GOAL #6   Title pt will have improved QuickDASH score by >8 points to demonstrate improved overall function.   Baseline 38% (7/31), 06/19/16: 22.73%   Time 8   Period Weeks   Status Achieved     PT LONG TERM GOAL #7   Title Pt will have improved QuickDASH score by >8 points to demonstrate improved overall function.   Baseline 06/19/16: 22.73%   Time 6   Period Weeks   Status New               Plan - 07/20/16 1009    Clinical Impression Statement Pt was able to perform strengthening activities with 1 c/o mild pulling during IR exercises, with reports of no pain or pulling once moved into less R shoulder horizontal ABD. Pt was able to progress reps today and states "shoulder feels good" when asked throughout activities. Pt was given yellow tband for home for forward flexion and scaption instead of red for improved technique and ROM. Pt would continue to benefit from skilled PT in order to address R shoulder weakness, pain, increase AROM, and improve her ability to perform ADLs.    Rehab Potential Good   Clinical Impairments Affecting Rehab Potential age, complexity of surgery   PT Frequency 2x / week   PT Duration 12 weeks   PT Treatment/Interventions ADLs/Self Care Home Management;Aquatic Therapy;Cryotherapy;Electrical Stimulation;Moist Heat;Ultrasound;Patient/family education;Therapeutic exercise;Therapeutic activities;Functional mobility  training;Manual techniques;Dry needling;Passive range of motion;Taping   PT Next Visit Plan Consider adding standing forward punch to HEP.  Continue with R shoulder AAROM, manual therapy, minimize shoulder hiking, periscapular strengthening,    PT Home Exercise Plan Standing R shoulder F and scaption with RTB; standing R shoulder abduction; Standing ER/IR with towel roll and RTB   Consulted and Agree with Plan of Care Patient      Patient will benefit from skilled therapeutic intervention in order to improve the following deficits and impairments:  Decreased activity tolerance, Decreased endurance, Decreased range of motion, Decreased safety  awareness, Decreased strength, Hypomobility, Impaired UE functional use, Pain, Impaired sensation  Visit Diagnosis: Pain in right shoulder  Abnormal posture     Problem List Patient Active Problem List   Diagnosis Date Noted  . Full thickness rotator cuff tear 03/09/2016   Trula Ore, SPT This entire session was performed under direct supervision and direction of a licensed therapist/therapist assistant . I have personally read, edited and approve of the note as written.  Trotter,Margaret PT, DPT 07/20/2016, 3:49 PM  [place] [facility] MAIN [facility] SERVICES [address] [place], [place], [address] Phone: [phone]   Fax:  [phone]  Name: Veronica Olsen MRN: [id number] Date of Birth: 11/10/1940

## 2016-07-25 ENCOUNTER — Encounter: Payer: Self-pay | Admitting: Physical Therapy

## 2016-07-25 ENCOUNTER — Ambulatory Visit: Payer: PPO | Attending: Orthopedic Surgery | Admitting: Physical Therapy

## 2016-07-25 DIAGNOSIS — R293 Abnormal posture: Secondary | ICD-10-CM | POA: Diagnosis not present

## 2016-07-25 DIAGNOSIS — G8929 Other chronic pain: Secondary | ICD-10-CM | POA: Diagnosis not present

## 2016-07-25 DIAGNOSIS — M25511 Pain in right shoulder: Secondary | ICD-10-CM | POA: Diagnosis not present

## 2016-07-25 NOTE — Therapy (Signed)
[place] MAIN [facility] SERVICES [address] [place], [place], [address] Phone: [phone]   Fax:  [phone]  Physical Therapy Treatment  Patient Details  Name: Veronica Olsen MRN: [id number] Date of Birth: 08/29/1941 No Data Recorded  Encounter Date: 07/25/2016      PT End of Session - 07/25/16 1258    Visit Number 36   Number of Visits 49   Date for PT Re-Evaluation 08/14/16   Authorization Type 10/10   PT Start Time 0926   PT Stop Time 1013   PT Time Calculation (min) 47 min   Activity Tolerance Patient tolerated treatment well   Behavior During Therapy [facility] for tasks assessed/performed      History reviewed. No pertinent past medical history.  Past Surgical History:  Procedure Laterality Date  . APPENDECTOMY     AGE 74  . SHOULDER ARTHROSCOPY WITH OPEN ROTATOR CUFF REPAIR AND DISTAL CLAVICLE ACROMINECTOMY Right 03/09/2016   Procedure: SHOULDER ARTHROSCOPY WITH OPEN ROTATOR CUFF REPAIR AND DISTAL CLAVICLE ACROMINECTOMY;  Surgeon: Thornton Park, MD;  Location: [facility] ORS;  Service: Orthopedics;  Laterality: Right;    There were no vitals filed for this visit.      Subjective Assessment - 07/25/16 0928    Subjective Pt notes she is doing well and that the yellow tband makes the shoulder exercises a little better at home. She has no questions or concerns   Diagnostic tests MRI   Patient Stated Goals Pt reports she would like to raise her arm overhead   Currently in Pain? No/denies      Treatment: Manual Therapy: STM to latissimus dorsi muscle ~4 minutes. Passive stretch of latissimus dorsi by blocking scapular movement while performing passive shoulder abduction, 3x15 sec holds; ~4 minutes  TherEx; SPT performed PROM into flexion, abduction, and ER, x5 into each motion, min VCs for patient to relax more fully Doorway biceps stretch, x2, min-mod VCs and tactile cueing for positioning, pt did not feel stretch so  d/c Cross body stretch, 3x20 seconds, pt in seated moved RUE passively with LUE Plantigrade lat stretch, 3x20 sec holds, min VCs for body placement and continued stretching; min VCs to not push into pain.  Plantigrade BUE weight shifts; forward/backwards and lateral; 2x10, min VCs to maintain RUE stable during movements Forward R shoulder flexion with 1# weight, 2x8, min tactile cueing to decrease upper trap activation Plantigrade scapular retraction RUE only, 2# weight, 2x10, min VCs to perform scapular retraction at end range Sidelying shoulder abduction with short lever arm, 2x10 min VCs to decrease range and to not move into pain, Sidelying ER with 2# hand weight, towel underneath distal humerus, 2x10, min VCs for initial set up of exercise Standing ER against wall to 3 different points (up, middle, down), red tband resistance, 3x5, min VCs for initial set up  Dynamic stabilization CKC with RUE on ball, 3x10 perturbations at proximal elbow, min VCs to maintain elbow in a still position                            PT Education - 07/25/16 1257    Education provided Yes   Education Details continuing with HEP, exercises without pain   Person(s) Educated Patient   Methods Explanation   Comprehension Verbalized understanding             PT Long Term Goals - 07/25/16 1304      PT LONG TERM  GOAL #1   Title pt will demonstrate at least 140 deg R shoulder PROM to return to funciton   Baseline 148 (05/22/16),   Time 4   Period Weeks   Status Achieved     PT LONG TERM GOAL #2   Title pt will be independent and compliant with HEP   Time 8   Period Weeks   Status On-going     PT LONG TERM GOAL #3   Title pt will demonstrate 140 deg of R shoulder flexion AROM to return to ADLs   Baseline 90 flex, 74 abd, 47 IR, 38 ER, 06/19/16: Achieved 150 in supine, WFL in sitting   Time 12   Period Weeks   Status Partially Met     PT LONG TERM GOAL #4   Title pt will be  independent and compliant with progressive strengthening program to return to PLOF   Time 12   Period Weeks   Status On-going     PT LONG TERM GOAL #5   Title pt will be able to improve shoulder IR to 45 deg to be able to fasten her bra   Baseline 06/19/16: 68 degrees   Time 12   Period Weeks   Status Achieved     PT LONG TERM GOAL #6   Title pt will have improved QuickDASH score by >8 points to demonstrate improved overall function.   Baseline 38% (7/31), 06/19/16: 22.73%   Time 8   Period Weeks   Status Achieved     PT LONG TERM GOAL #7   Title Pt will have improved QuickDASH score by >8 points to demonstrate improved overall function.   Baseline 06/19/16: 22.73%   Time 6   Period Weeks   Status On-going               Plan - 07/25/16 1258    Clinical Impression Statement Pt denies pain throughout exercises in therapy. Afterwards she states that her arm is slightly tired. Pt notes mild soreness with latissimus soft tissue mobilization and stretching. She has full passive ROM and WFL in shoulder AROM; limited shoulder PROM of shoulder ABD to just >90 degrees. Pt would continue to benefit from skilled PT in order to address R shoulder weakness, pain, increase AROM, and improve her ability to perform ADLs.   Rehab Potential Good   Clinical Impairments Affecting Rehab Potential age, complexity of surgery   PT Frequency 2x / week   PT Duration 12 weeks   PT Treatment/Interventions ADLs/Self Care Home Management;Aquatic Therapy;Cryotherapy;Electrical Stimulation;Moist Heat;Ultrasound;Patient/family education;Therapeutic exercise;Therapeutic activities;Functional mobility training;Manual techniques;Dry needling;Passive range of motion;Taping   PT Next Visit Plan Consider adding standing forward punch to HEP.  Continue with R shoulder AAROM, manual therapy, minimize shoulder hiking, periscapular strengthening,    PT Home Exercise Plan Standing R shoulder F and scaption with RTB;  standing R shoulder abduction; Standing ER/IR with towel roll and RTB   Consulted and Agree with Plan of Care Patient      Patient will benefit from skilled therapeutic intervention in order to improve the following deficits and impairments:  Decreased activity tolerance, Decreased endurance, Decreased range of motion, Decreased safety awareness, Decreased strength, Hypomobility, Impaired UE functional use, Pain, Impaired sensation  Visit Diagnosis: Right shoulder pain, unspecified chronicity  Abnormal posture     Problem List Patient Active Problem List   Diagnosis Date Noted  . Full thickness rotator cuff tear 03/09/2016   Tilman Neat, SPT This entire session was performed under direct  supervision and direction of a licensed Chiropractor . I have personally read, edited and approve of the note as written.  Trotter,Margaret PT, DPT 07/26/2016, 9:36 AM  [place] MAIN [facility] SERVICES [address] [place], [place], [address] Phone: [phone]   Fax:  [phone]  Name: Veronica Olsen MRN: [id number] Date of Birth: [birth year]

## 2016-07-27 ENCOUNTER — Encounter: Payer: Self-pay | Admitting: Physical Therapy

## 2016-07-27 ENCOUNTER — Ambulatory Visit: Payer: PPO | Admitting: Physical Therapy

## 2016-07-27 DIAGNOSIS — M25511 Pain in right shoulder: Secondary | ICD-10-CM | POA: Diagnosis not present

## 2016-07-27 DIAGNOSIS — R293 Abnormal posture: Secondary | ICD-10-CM

## 2016-07-27 NOTE — Therapy (Signed)
[place] MAIN [facility] SERVICES [address] [place], [place], [address] Phone: [phone]   Fax:  [phone]  Physical Therapy Treatment  Patient Details  Name: Veronica Olsen MRN: [id number] Date of Birth: October 17, 1941 No Data Recorded  Encounter Date: 07/27/2016      PT End of Session - 07/27/16 1214    Visit Number 37   Number of Visits 49   Date for PT Re-Evaluation 08/14/16   Authorization Type 1/10   PT Start Time 0931   PT Stop Time 1014   PT Time Calculation (min) 43 min   Activity Tolerance Patient tolerated treatment well   Behavior During Therapy [facility] for tasks assessed/performed      History reviewed. No pertinent past medical history.  Past Surgical History:  Procedure Laterality Date  . APPENDECTOMY     AGE 75  . SHOULDER ARTHROSCOPY WITH OPEN ROTATOR CUFF REPAIR AND DISTAL CLAVICLE ACROMINECTOMY Right 03/09/2016   Procedure: SHOULDER ARTHROSCOPY WITH OPEN ROTATOR CUFF REPAIR AND DISTAL CLAVICLE ACROMINECTOMY;  Surgeon: Thornton Park, MD;  Location: [facility] ORS;  Service: Orthopedics;  Laterality: Right;    There were no vitals filed for this visit.      Subjective Assessment - 07/27/16 0934    Subjective Pt notes she was mildly sore after last session, but notes that the sorness went away with just a little bit lasting until today in biceps and lats area. She has no questions or concerns and denies pain.    Diagnostic tests MRI   Patient Stated Goals Pt reports she would like to raise her arm overhead   Currently in Pain? Yes   Pain Score 1    Pain Location Shoulder   Pain Orientation Right   Pain Descriptors / Indicators Sore     Treatment: Manual Therapy: Patient in sidelying and supine (incline) for STM mobs. STM to latissimus dorsi muscle and biceps muscle ~15 minutes, trigger points in both areas; patient has decrease tightness in R lat muscle after soft tissue work Passive stretch of latissimus dorsi  by blocking scapular movement while performing passive shoulder abduction, 4x15 sec holds; ~8 minutes AP and inferior R GH shoulder joint mobs, 3x10 each; Gr I-II, patient had mild fasciculations due to muscle tightness, which decreased with prolonged mobs.  Patient notes decreased tightness after manual therapy. Seated R shoulder flexion: 96 degrees with mild soreness pull in R lat muscle.   TherEx; SPT performed PROM into flexion, abduction, and ER, x10 into each motion, mod VCs for patient to relax more fully Cross body stretch, 3x20 seconds, pt in seated moved RUE passively with LUE Plantigrade lat stretch, 3x20 sec holds, min VCs for body placement and continued stretching; min VCs to not push into pain.                                    PT Education - 07/27/16 1213    Education provided Yes   Education Details tight musculature, decreasing ROM of band at home if too much tension   Person(s) Educated Patient   Methods Explanation   Comprehension Verbalized understanding             PT Long Term Goals - 07/25/16 1304      PT LONG TERM GOAL #1   Title pt will demonstrate at least 140 deg R shoulder PROM to return to funciton   Baseline 148 (  05/22/16),   Time 4   Period Weeks   Status Achieved     PT LONG TERM GOAL #2   Title pt will be independent and compliant with HEP   Time 8   Period Weeks   Status On-going     PT LONG TERM GOAL #3   Title pt will demonstrate 140 deg of R shoulder flexion AROM to return to ADLs   Baseline 90 flex, 74 abd, 47 IR, 38 ER, 06/19/16: Achieved 150 in supine, WFL in sitting   Time 12   Period Weeks   Status Partially Met     PT LONG TERM GOAL #4   Title pt will be independent and compliant with progressive strengthening program to return to PLOF   Time 12   Period Weeks   Status On-going     PT LONG TERM GOAL #5   Title pt will be able to improve shoulder IR to 45 deg to be able to fasten her bra    Baseline 06/19/16: 68 degrees   Time 12   Period Weeks   Status Achieved     PT LONG TERM GOAL #6   Title pt will have improved QuickDASH score by >8 points to demonstrate improved overall function.   Baseline 38% (7/31), 06/19/16: 22.73%   Time 8   Period Weeks   Status Achieved     PT LONG TERM GOAL #7   Title Pt will have improved QuickDASH score by >8 points to demonstrate improved overall function.   Baseline 06/19/16: 22.73%   Time 6   Period Weeks   Status On-going               Plan - 07/27/16 1214    Clinical Impression Statement Pt has point tenderness at proximal biceps and lats on R shoulder. She has trigger points in both muscles with soft tissue muscle assessment. Due to soreness, manual therapy was focused on to decrease trigger points and and soreness with movement. Pt would continue to benefit from skilled PT to address RUE tightness, improve ROM, and increase strength.    Rehab Potential Good   Clinical Impairments Affecting Rehab Potential age, complexity of surgery   PT Frequency 2x / week   PT Duration 12 weeks   PT Treatment/Interventions ADLs/Self Care Home Management;Aquatic Therapy;Cryotherapy;Electrical Stimulation;Moist Heat;Ultrasound;Patient/family education;Therapeutic exercise;Therapeutic activities;Functional mobility training;Manual techniques;Dry needling;Passive range of motion;Taping   PT Next Visit Plan Consider adding standing forward punch to HEP.  Continue with R shoulder AAROM, manual therapy, minimize shoulder hiking, periscapular strengthening,    PT Home Exercise Plan Standing R shoulder F and scaption with RTB; standing R shoulder abduction; Standing ER/IR with towel roll and RTB   Consulted and Agree with Plan of Care Patient      Patient will benefit from skilled therapeutic intervention in order to improve the following deficits and impairments:  Decreased activity tolerance, Decreased endurance, Decreased range of motion,  Decreased safety awareness, Decreased strength, Hypomobility, Impaired UE functional use, Pain, Impaired sensation  Visit Diagnosis: Right shoulder pain, unspecified chronicity  Abnormal posture     Problem List Patient Active Problem List   Diagnosis Date Noted  . Full thickness rotator cuff tear 03/09/2016   Tilman Neat, SPT Tilman Neat 07/27/2016, 12:17 PM   This entire session was performed under direct supervision and direction of a licensed therapist/therapist assistant . I have personally read, edited and approve of the note as written. Collie Siad PT, DPT  [place]  [place] MAIN [facility] SERVICES [place], [place], [address] Phone: [phone]   Fax:  [phone]  Name: Veronica Olsen MRN: [id number] Date of Birth: 01/30/1941

## 2016-08-01 ENCOUNTER — Ambulatory Visit: Payer: PPO | Admitting: Physical Therapy

## 2016-08-01 ENCOUNTER — Encounter: Payer: Self-pay | Admitting: Physical Therapy

## 2016-08-01 DIAGNOSIS — R293 Abnormal posture: Secondary | ICD-10-CM

## 2016-08-01 DIAGNOSIS — M25511 Pain in right shoulder: Secondary | ICD-10-CM | POA: Diagnosis not present

## 2016-08-01 DIAGNOSIS — G8929 Other chronic pain: Secondary | ICD-10-CM

## 2016-08-01 NOTE — Therapy (Signed)
[place] MAIN [facility] SERVICES [address] [place], [place], [address] Phone: [phone]   Fax:  [phone]  Physical Therapy Treatment  Patient Details  Name: Veronica Olsen MRN: [id number] Date of Birth: 1941/07/18 No Data Recorded  Encounter Date: 08/01/2016      PT End of Session - 08/01/16 1007    Visit Number 38   Number of Visits 49   Date for PT Re-Evaluation 08/14/16   Authorization Type 2/10   PT Start Time 0931   PT Stop Time 1013   PT Time Calculation (min) 42 min   Activity Tolerance Patient tolerated treatment well   Behavior During Therapy [facility] for tasks assessed/performed      History reviewed. No pertinent past medical history.  Past Surgical History:  Procedure Laterality Date  . APPENDECTOMY     AGE 75  . SHOULDER ARTHROSCOPY WITH OPEN ROTATOR CUFF REPAIR AND DISTAL CLAVICLE ACROMINECTOMY Right 03/09/2016   Procedure: SHOULDER ARTHROSCOPY WITH OPEN ROTATOR CUFF REPAIR AND DISTAL CLAVICLE ACROMINECTOMY;  Surgeon: Thornton Park, MD;  Location: [facility] ORS;  Service: Orthopedics;  Laterality: Right;    There were no vitals filed for this visit.      Subjective Assessment - 08/01/16 0930    Subjective Patient states she felt good over the weekend with no heaviness or sorness. She states her HEP is going well with no questions or concerns.   Diagnostic tests MRI   Patient Stated Goals Pt reports she would like to raise her arm overhead   Currently in Pain? No/denies      Treatment: Manual Therapy: STM to latissimus dorsi muscle ~5 minutes. With passive stretch of latissimus dorsi by blocking scapular movement while performing passive shoulder abduction, intermittently x3.   TherEx; SPT performed PROM into flexion, abduction, ext, IR and ER, x5 into each motion, min VCs for patient to relax more fully Sidelying shoulder ER, 2#, 3x10, min VCs to perform with smooth movement Sidelying shoulder ABD, no  weight, 3x5, min VCs for initial set up and to perform in more of a scapular plane Seated IR with red tband resistance, 2x10 min VCs for good posture and performing with [place] joint Scapular retraction with 3# weight, 3x10, min VCs for lower arm placement and decreased UE activation Forward flexion and scaption with bent arm, 2x10 each, min VCs to not move into pain and control, no weight due to fatigue after last session Dynamic stabilization with RUE on small ball, 3x10 perturbations to proximal elbow, min VCs to maintain stability                               PT Education - 08/01/16 1007    Education provided Yes   Education Details continuing with HEP, body positioning   Person(s) Educated Patient   Methods Explanation   Comprehension Verbalized understanding             PT Long Term Goals - 07/25/16 1304      PT LONG TERM GOAL #1   Title pt will demonstrate at least 140 deg R shoulder PROM to return to funciton   Baseline 148 (05/22/16),   Time 4   Period Weeks   Status Achieved     PT LONG TERM GOAL #2   Title pt will be independent and compliant with HEP   Time 8   Period Weeks   Status On-going  PT LONG TERM GOAL #3   Title pt will demonstrate 140 deg of R shoulder flexion AROM to return to ADLs   Baseline 90 flex, 74 abd, 47 IR, 38 ER, 06/19/16: Achieved 150 in supine, WFL in sitting   Time 12   Period Weeks   Status Partially Met     PT LONG TERM GOAL #4   Title pt will be independent and compliant with progressive strengthening program to return to PLOF   Time 12   Period Weeks   Status On-going     PT LONG TERM GOAL #5   Title pt will be able to improve shoulder IR to 45 deg to be able to fasten her bra   Baseline 06/19/16: 68 degrees   Time 12   Period Weeks   Status Achieved     PT LONG TERM GOAL #6   Title pt will have improved QuickDASH score by >8 points to demonstrate improved overall function.   Baseline 38% (7/31),  06/19/16: 22.73%   Time 8   Period Weeks   Status Achieved     PT LONG TERM GOAL #7   Title Pt will have improved QuickDASH score by >8 points to demonstrate improved overall function.   Baseline 06/19/16: 22.73%   Time 6   Period Weeks   Status On-going               Plan - 08/01/16 1014    Clinical Impression Statement Pt notes improved lats point tenderness with less pain and pulling during R shoulder flexion/scaption. Pt is able to tolerate muscle strengthening well. She was given more breaks in order to decrease the "heaviness" feeling after 2 sessions ago. She requires min VCs for posture throughout exercises. pt would continue to benefit from skilled PT to address R UE strength and ROM as well as posture.    Rehab Potential Good   Clinical Impairments Affecting Rehab Potential age, complexity of surgery   PT Frequency 2x / week   PT Duration 12 weeks   PT Treatment/Interventions ADLs/Self Care Home Management;Aquatic Therapy;Cryotherapy;Electrical Stimulation;Moist Heat;Ultrasound;Patient/family education;Therapeutic exercise;Therapeutic activities;Functional mobility training;Manual techniques;Dry needling;Passive range of motion;Taping   PT Next Visit Plan Consider adding standing forward punch to HEP.  Continue with R shoulder AAROM, manual therapy, minimize shoulder hiking, periscapular strengthening,    PT Home Exercise Plan Standing R shoulder F and scaption with RTB; standing R shoulder abduction; Standing ER/IR with towel roll and RTB   Consulted and Agree with Plan of Care Patient      Patient will benefit from skilled therapeutic intervention in order to improve the following deficits and impairments:  Decreased activity tolerance, Decreased endurance, Decreased range of motion, Decreased safety awareness, Decreased strength, Hypomobility, Impaired UE functional use, Pain, Impaired sensation  Visit Diagnosis: Right shoulder pain, unspecified chronicity  Abnormal  posture  Chronic right shoulder pain     Problem List Patient Active Problem List   Diagnosis Date Noted  . Full thickness rotator cuff tear 03/09/2016   Tilman Neat, SPT This entire session was performed under direct supervision and direction of a licensed therapist/therapist assistant . I have personally read, edited and approve of the note as written.  Trotter,Margaret PT, DPT 08/01/2016, 10:45 AM  [place] MAIN [facility] SERVICES [address] [place], [place], [address] Phone: [phone]   Fax:  [phone]  Name: RALYN STLAURENT MRN: [id number] Date of Birth: 04-09-1941

## 2016-08-03 ENCOUNTER — Encounter: Payer: Self-pay | Admitting: Physical Therapy

## 2016-08-03 ENCOUNTER — Ambulatory Visit: Payer: PPO | Admitting: Physical Therapy

## 2016-08-03 DIAGNOSIS — G8929 Other chronic pain: Secondary | ICD-10-CM

## 2016-08-03 DIAGNOSIS — M25511 Pain in right shoulder: Secondary | ICD-10-CM | POA: Diagnosis not present

## 2016-08-03 DIAGNOSIS — R293 Abnormal posture: Secondary | ICD-10-CM

## 2016-08-03 NOTE — Therapy (Signed)
[place] MAIN [facility] SERVICES [address] [place], [place], [address] Phone: [phone]   Fax:  [phone]  Physical Therapy Treatment  Patient Details  Name: Veronica Olsen MRN: [id number] Date of Birth: 20-Jun-1941 No Data Recorded  Encounter Date: 08/03/2016      PT End of Session - 08/03/16 0946    Visit Number 39   Number of Visits 49   Date for PT Re-Evaluation 08/14/16   Authorization Type 3/10   PT Start Time 0932   PT Stop Time 1014   PT Time Calculation (min) 42 min   Activity Tolerance Patient tolerated treatment well   Behavior During Therapy [facility] for tasks assessed/performed      History reviewed. No pertinent past medical history.  Past Surgical History:  Procedure Laterality Date  . APPENDECTOMY     AGE 75  . SHOULDER ARTHROSCOPY WITH OPEN ROTATOR CUFF REPAIR AND DISTAL CLAVICLE ACROMINECTOMY Right 03/09/2016   Procedure: SHOULDER ARTHROSCOPY WITH OPEN ROTATOR CUFF REPAIR AND DISTAL CLAVICLE ACROMINECTOMY;  Surgeon: Thornton Park, MD;  Location: [facility] ORS;  Service: Orthopedics;  Laterality: Right;    There were no vitals filed for this visit.      Subjective Assessment - 08/03/16 0933    Subjective Pt reports she is not having any pain today and hasn't had much soreness after sessions.     Diagnostic tests MRI   Patient Stated Goals Pt reports she would like to raise her arm overhead   Currently in Pain? No/denies      Treatment PROM to R shoulder including flexion, abduction, IR, ER for 10 mins to loosen up before performing therapeutic ex. Min VCS for increased relaxtion, min A to stabilize R scapula down when performing passive shoulder flexion, pt reported less stiffness after PROM.  ER continues to show most notable tightness  MET in 90/90 position to increase R shoulder ER, x 10 pushes with relaxation in between, notable change in active ER in after MET  Serratus punches with #1, 2 sets x 10 reps,  min VCs for positioning and form   Sidelying ER with #2, min tactile cues with towel roll to maintain elbow by side, 2 sets x 10 reps, min VCs for proper breathing technique  Sidelying abduction with cues to position thumb to the ceiling, no weight, 2 sets x 10 reps, min VCs to maintain elbow flexion  Internal rotation ball squeezes seated with unweighted ball, 2 sets x 10 reps, min VCS for upright seated posture Standing scaption, min A to stabilize scapula down, instructed to only go to 110 degrees of scaption before shoulder girdle wants to rise, 2 sets x 10 reps Scapular retraction and tricep kick back in plantargrade with 2lb weight, 2 sets x 10 reps, min VCs for form and increased isometric scapular retraction  Mid row on cable machine, #7.5, 2 sets x 10 reps, min VCs for increased scapular retractions  ER wall climbs with yellow theraband resistance x 10 reps up/down wall, min VCs for proper placement and increased ER                           PT Education - 08/03/16 0945    Education provided Yes   Education Details icing at home for comfort    Person(s) Educated Patient   Methods Explanation;Demonstration;Tactile cues   Comprehension Verbalized understanding;Returned demonstration;Verbal cues required  PT Long Term Goals - 07/25/16 1304      PT LONG TERM GOAL #1   Title pt will demonstrate at least 140 deg R shoulder PROM to return to funciton   Baseline 148 (05/22/16),   Time 4   Period Weeks   Status Achieved     PT LONG TERM GOAL #2   Title pt will be independent and compliant with HEP   Time 8   Period Weeks   Status On-going     PT LONG TERM GOAL #3   Title pt will demonstrate 140 deg of R shoulder flexion AROM to return to ADLs   Baseline 90 flex, 74 abd, 47 IR, 38 ER, 06/19/16: Achieved 150 in supine, WFL in sitting   Time 12   Period Weeks   Status Partially Met     PT LONG TERM GOAL #4   Title pt will be independent and  compliant with progressive strengthening program to return to PLOF   Time 12   Period Weeks   Status On-going     PT LONG TERM GOAL #5   Title pt will be able to improve shoulder IR to 45 deg to be able to fasten her bra   Baseline 06/19/16: 68 degrees   Time 12   Period Weeks   Status Achieved     PT LONG TERM GOAL #6   Title pt will have improved QuickDASH score by >8 points to demonstrate improved overall function.   Baseline 38% (7/31), 06/19/16: 22.73%   Time 8   Period Weeks   Status Achieved     PT LONG TERM GOAL #7   Title Pt will have improved QuickDASH score by >8 points to demonstrate improved overall function.   Baseline 06/19/16: 22.73%   Time 6   Period Weeks   Status On-going               Plan - 08/03/16 1031    Clinical Impression Statement Pt continues to report no pain throughout session.  Passive ER seemed to improve some with MET to internal rotators in reclined position.  Continued to work on rotator cuff strengthening with sidelying ER, wall climbs with yellow theraband resistance and sidelying abduction.  Pt demonstrated adequate form throughout and only min VCs for initial position.  Initated IR ball squeezes and mid row on cable machine to further advance strengthening. Pt required min VCs for proper breathing technique and min tactile cues for increased scapular retraction.  Pt would benefit from further skilled PT to work on UE AROM without compensation and increased rotator cuff strengthening for further functional mobility of R UE.     Rehab Potential Good   Clinical Impairments Affecting Rehab Potential age, complexity of surgery   PT Frequency 2x / week   PT Duration 12 weeks   PT Treatment/Interventions ADLs/Self Care Home Management;Aquatic Therapy;Cryotherapy;Electrical Stimulation;Moist Heat;Ultrasound;Patient/family education;Therapeutic exercise;Therapeutic activities;Functional mobility training;Manual techniques;Dry needling;Passive range  of motion;Taping   PT Next Visit Plan Consider adding standing forward punch to HEP.  Continue with R shoulder AAROM, manual therapy, minimize shoulder hiking, periscapular strengthening,    PT Home Exercise Plan Standing R shoulder F and scaption with RTB; standing R shoulder abduction; Standing ER/IR with towel roll and RTB   Consulted and Agree with Plan of Care Patient      Patient will benefit from skilled therapeutic intervention in order to improve the following deficits and impairments:  Decreased activity tolerance, Decreased endurance, Decreased range of motion,  Decreased safety awareness, Decreased strength, Hypomobility, Impaired UE functional use, Pain, Impaired sensation  Visit Diagnosis: Right shoulder pain, unspecified chronicity  Abnormal posture  Chronic right shoulder pain     Problem List Patient Active Problem List   Diagnosis Date Noted  . Full thickness rotator cuff tear 03/09/2016   Stacy Gardner, SPT  This entire session was performed under direct supervision and direction of a licensed therapist/therapist assistant . I have personally read, edited and approve of the note as written.  Trotter,Margaret PT, DPT 08/03/2016, 1:51 PM  [place] MAIN [facility] SERVICES [address] [place], [place], [address] Phone: [phone]   Fax:  [phone]  Name: Veronica Olsen MRN: [id number] Date of Birth: [birth year]

## 2016-08-08 ENCOUNTER — Ambulatory Visit: Payer: PPO | Admitting: Physical Therapy

## 2016-08-08 ENCOUNTER — Encounter: Payer: Self-pay | Admitting: Physical Therapy

## 2016-08-08 DIAGNOSIS — G8929 Other chronic pain: Secondary | ICD-10-CM

## 2016-08-08 DIAGNOSIS — M25511 Pain in right shoulder: Secondary | ICD-10-CM | POA: Diagnosis not present

## 2016-08-08 DIAGNOSIS — R293 Abnormal posture: Secondary | ICD-10-CM

## 2016-08-08 NOTE — Therapy (Addendum)
[place] MAIN [facility] SERVICES [address] [place], [place], [address] Phone: [phone]   Fax:  [phone]  Physical Therapy Treatment  Patient Details  Name: Veronica Olsen MRN: [id number] Date of Birth: Sep 08, 1941 No Data Recorded  Encounter Date: 08/08/2016      PT End of Session - 08/08/16 1124    Visit Number 40   Number of Visits 49   Date for PT Re-Evaluation 08/14/16   Authorization Type 4/10   PT Start Time 0931   PT Stop Time 1014   PT Time Calculation (min) 43 min   Activity Tolerance Patient tolerated treatment well   Behavior During Therapy [facility] [place] [facility] for tasks assessed/performed      History reviewed. No pertinent past medical history.  Past Surgical History:  Procedure Laterality Date  . APPENDECTOMY     AGE 75  . SHOULDER ARTHROSCOPY WITH OPEN ROTATOR CUFF REPAIR AND DISTAL CLAVICLE ACROMINECTOMY Right 03/09/2016   Procedure: SHOULDER ARTHROSCOPY WITH OPEN ROTATOR CUFF REPAIR AND DISTAL CLAVICLE ACROMINECTOMY;  Surgeon: Thornton Park, MD;  Location: [facility] ORS;  Service: Orthopedics;  Laterality: Right;    There were no vitals filed for this visit.      Subjective Assessment - 08/08/16 0934    Subjective Pt reports she is doing well. She has no questions or concerns but states her exercises are getting easy. Patient states she can wear a bra without pain since Saturday and can now shampoo her hair with her RUE   Diagnostic tests MRI   Patient Stated Goals Pt reports she would like to raise her arm overhead   Currently in Pain? No/denies      Treatment: Manual: SPT performed PROM into flexion, abduction, ext, IR and ER, x5 into each motion, min VCs for patient to relax more fully. MET in 90/90 position to R [facility] joint into ER, x 5 pushes with relaxation in between and passive stretching; notable change in active ER in after MET  Gr II inferior mob to R [place] joint in end range ABD, ~3 minutes; with 45 second bouts;   increased gross passive ROM into ABD after mobilization.  TherEx; Instructed patient to increase scapular movements to green tband and forward flexion/scaption to red, but continue other exercises with red tband  Sidelying ER, 2# weight, towel at distal elbow to encourage slight ABD,  2x12, min VCs for initial set up Sidelying ABD, shorter lever arm with arm bent slightly, no weight, min VCs for slight ER with movement, 3x5  Forward flexion with 1#, 2x10, min VCs to decrease ROM in order to decrease upper trap activation Scapular retraction with tricep kickbacks, 2x10, 2#, Min VCs for elbow positioning closer to body Scapular retraction with Matrix machine cable rows, 7.5#, 2x10, min VCs for initial set up Elbow bent, wall slides into scaption, body positioned at 45 degrees to the wall, 2x10 ER walk outs on wall, yellow tband resistance, 2x5, min VCs to set scapula before performing Dynamic stabilization in standing and [place], 2x10 perturbations, min VCs to set scapula before perturbation                           PT Education - 08/08/16 1124    Education provided Yes   Education Details increasing scapular movements at home to green tband and increasing forward flexion/scaption to red tband   Person(s) Educated Patient   Methods Explanation   Comprehension Verbalized understanding  PT Long Term Goals - 07/25/16 1304      PT LONG TERM GOAL #1   Title pt will demonstrate at least 140 deg R shoulder PROM to return to funciton   Baseline 148 (05/22/16),   Time 4   Period Weeks   Status Achieved     PT LONG TERM GOAL #2   Title pt will be independent and compliant with HEP   Time 8   Period Weeks   Status On-going     PT LONG TERM GOAL #3   Title pt will demonstrate 140 deg of R shoulder flexion AROM to return to ADLs   Baseline 90 flex, 73 abd, 47 IR, 38 ER, 06/19/16: Achieved 150 in supine, WFL in sitting   Time 12   Period Weeks   Status  Partially Met     PT LONG TERM GOAL #4   Title pt will be independent and compliant with progressive strengthening program to return to PLOF   Time 12   Period Weeks   Status On-going     PT LONG TERM GOAL #5   Title pt will be able to improve shoulder IR to 45 deg to be able to fasten her bra   Baseline 06/19/16: 68 degrees   Time 12   Period Weeks   Status Achieved     PT LONG TERM GOAL #6   Title pt will have improved QuickDASH score by >8 points to demonstrate improved overall function.   Baseline 38% (7/31), 06/19/16: 22.73%   Time 8   Period Weeks   Status Achieved     PT LONG TERM GOAL #7   Title Pt will have improved QuickDASH score by >8 points to demonstrate improved overall function.   Baseline 06/19/16: 22.73%   Time 6   Period Weeks   Status On-going               Plan - 08/08/16 1125    Clinical Impression Statement Pt denies pain or issues throughout session. Passive ER continues to improve after MET to IR in reclined position in RLE. Pt continues to require min VCs during exercises for appropriate technique and RUE positioning with decreased upper trap movement. The patient would continue to benefit from skilled PT to address RUE ROM , strengthening, and improve her ability to perform ADLs.    Rehab Potential Good   Clinical Impairments Affecting Rehab Potential age, complexity of surgery   PT Frequency 2x / week   PT Duration 12 weeks   PT Treatment/Interventions ADLs/Self Care Home Management;Aquatic Therapy;Cryotherapy;Electrical Stimulation;Moist Heat;Ultrasound;Patient/family education;Therapeutic exercise;Therapeutic activities;Functional mobility training;Manual techniques;Dry needling;Passive range of motion;Taping   PT Next Visit Plan Consider adding standing forward punch to HEP.  Continue with R shoulder AAROM, manual therapy, minimize shoulder hiking, periscapular strengthening,    PT Home Exercise Plan Standing R shoulder F and scaption with  RTB; standing R shoulder abduction; Standing ER/IR with towel roll and RTB   Consulted and Agree with Plan of Care Patient      Patient will benefit from skilled therapeutic intervention in order to improve the following deficits and impairments:  Decreased activity tolerance, Decreased endurance, Decreased range of motion, Decreased safety awareness, Decreased strength, Hypomobility, Impaired UE functional use, Pain, Impaired sensation  Visit Diagnosis: Abnormal posture  Chronic right shoulder pain     Problem List Patient Active Problem List   Diagnosis Date Noted  . Full thickness rotator cuff tear 03/09/2016   Tilman Neat, SPT  This  entire session was performed under direct supervision and direction of a licensed Chiropractor . I have personally read, edited and approve of the note as written.  Collie Siad PT, DPT 08/08/2016, 11:50 AM  [place] MAIN [facility] SERVICES [address] [place], [place], [address] Phone: [phone]   Fax:  [phone]  Name: Veronica Olsen MRN: [id number] Date of Birth: 04-Jun-1941

## 2016-08-10 ENCOUNTER — Ambulatory Visit: Payer: PPO | Admitting: Physical Therapy

## 2016-08-10 ENCOUNTER — Encounter: Payer: Self-pay | Admitting: Physical Therapy

## 2016-08-10 DIAGNOSIS — M25511 Pain in right shoulder: Secondary | ICD-10-CM

## 2016-08-10 DIAGNOSIS — G8929 Other chronic pain: Secondary | ICD-10-CM

## 2016-08-10 DIAGNOSIS — R293 Abnormal posture: Secondary | ICD-10-CM

## 2016-08-10 NOTE — Therapy (Addendum)
[place] MAIN [facility] SERVICES [address] [place], [place], [address] Phone: [phone]   Fax:  [phone]  Physical Therapy Treatment  Patient Details  Name: Veronica Olsen MRN: [id number] Date of Birth: 26-Feb-1941 No Data Recorded  Encounter Date: 08/10/2016        PT End of Session - 08/14/16 1600    Visit Number 41   Number of Visits 49   Date for PT Re-Evaluation 08/14/16   Authorization Type 5/10   PT Start Time 0930   PT Stop Time 1013   PT Time Calculation (min) 43 min   Activity Tolerance Patient tolerated treatment well   Behavior During Therapy [facility] for tasks assessed/performed       History reviewed. No pertinent past medical history.  Past Surgical History:  Procedure Laterality Date  . APPENDECTOMY     AGE 75  . SHOULDER ARTHROSCOPY WITH OPEN ROTATOR CUFF REPAIR AND DISTAL CLAVICLE ACROMINECTOMY Right 03/09/2016   Procedure: SHOULDER ARTHROSCOPY WITH OPEN ROTATOR CUFF REPAIR AND DISTAL CLAVICLE ACROMINECTOMY;  Surgeon: Thornton Park, MD;  Location: [facility] ORS;  Service: Orthopedics;  Laterality: Right;    There were no vitals filed for this visit.      Subjective Assessment - 08/10/16 0934    Subjective Patient reports she is doing well. She states her HEP progression was fine. She states she was only a little sore. She has no other issues or questions.     Diagnostic tests MRI   Patient Stated Goals Pt reports she would like to raise her arm overhead   Currently in Pain? No/denies     Treatment: Manual: SPT performed PROM into flexion, abduction, ext, IR and ER, x5 into each motion, min VCs for patient to relax more fully. MET in 90/90 position to R [facility] joint into ER, x 5 pushes with relaxation in between and passive stretching; notable change in active ER in after MET  MET in 45, 60, and 80 degrees of ABD to R GH into ADD x5 with following stretch into ABD  Gr II inferior mob to R [place] joint in end range  ABD, ~5 minutes; 3x 30 second bouts After inferior mobs and MET into ABD, patient had mild increase in ABD motion, continues to guard slighlty  TherEx; Sidelying ER, 2# weight, towel at distal elbow to encourage slight ABD,  2x12, min VCs for initial set up and to rest if muscle becomes fatigued Sidelying ABD, shorter lever arm with arm bent slightly, no weight, min VCs for positioning of exercise, 3x5  Forward flexion and scaption with 1#, 2x10 each, min VCs to decrease upper trap activation Scapular retraction with Matrix machine cable rows, 7.5#, 3x10, min VCs to increase scapular retraction  Elbow bent, wall slides into scaption, body positioned at 45 degrees to the wall, 2x10, min VCs not to perform into stretch or pain ER walk outs on wall, yellow tband resistance, 2x5, min VCs to set scapula before performing; patient notes mild fatigue, instructed that she should take longer rest breaks in order to avoid fatigue and muscle soreness; also instructed to use ice after therapy IR belly pushes on ball, 2x8, min VCs for initial set up and maintaining hold for 2-3 seconds and relaxing Dynamic stabilization in standing, OKC, 2x10 perturbations, min VCs to set scapula before perturbations begin Patient denies pain or irritation throughout session.  PT Education - 08/10/16 1126    Education provided Yes   Education Details importance of rest breaks in order to decrease soreness and improve muscle contraction later, icing to decrease pain prn   Person(s) Educated Patient   Methods Explanation   Comprehension Verbalized understanding             PT Long Term Goals - 07/25/16 1304      PT LONG TERM GOAL #1   Title pt will demonstrate at least 140 deg R shoulder PROM to return to funciton   Baseline 148 (05/22/16),   Time 4   Period Weeks   Status Achieved     PT LONG TERM GOAL #2   Title pt will be independent and compliant with HEP    Time 8   Period Weeks   Status On-going     PT LONG TERM GOAL #3   Title pt will demonstrate 140 deg of R shoulder flexion AROM to return to ADLs   Baseline 90 flex, 74 abd, 47 IR, 38 ER, 06/19/16: Achieved 150 in supine, WFL in sitting   Time 12   Period Weeks   Status Partially Met     PT LONG TERM GOAL #4   Title pt will be independent and compliant with progressive strengthening program to return to PLOF   Time 12   Period Weeks   Status On-going     PT LONG TERM GOAL #5   Title pt will be able to improve shoulder IR to 45 deg to be able to fasten her bra   Baseline 06/19/16: 68 degrees   Time 12   Period Weeks   Status Achieved     PT LONG TERM GOAL #6   Title pt will have improved QuickDASH score by >8 points to demonstrate improved overall function.   Baseline 38% (7/31), 06/19/16: 22.73%   Time 8   Period Weeks   Status Achieved     PT LONG TERM GOAL #7   Title Pt will have improved QuickDASH score by >8 points to demonstrate improved overall function.   Baseline 06/19/16: 22.73%   Time 6   Period Weeks   Status On-going               Plan - 08/10/16 1128    Clinical Impression Statement Pt denies pain and notes mild fatigue throughout session. The patient was educated about the importance of rest breaks and decreasing reps if needed to decrease soreness and extreme muscle fatigue after therapy sessions. The patient understands but requires mutliple cues to increase rest breaks. The patient continues to have improved PROM into ER with MET to IR, and has mildly improve ABD after manual therapy and MET to ADDs of RUE. The patient requires min VCs during exercises to increase rest breaks and not push through fatigue and to decrease upper trap activation. The patient would continue to benefit from skilled PT in order to address RUE ROM, strengthening, and improver her ability to perform ADLs.   Rehab Potential Good   Clinical Impairments Affecting Rehab Potential  age, complexity of surgery   PT Frequency 2x / week   PT Duration 12 weeks   PT Treatment/Interventions ADLs/Self Care Home Management;Aquatic Therapy;Cryotherapy;Electrical Stimulation;Moist Heat;Ultrasound;Patient/family education;Therapeutic exercise;Therapeutic activities;Functional mobility training;Manual techniques;Dry needling;Passive range of motion;Taping   PT Next Visit Plan Consider adding standing forward punch to HEP.  Continue with R shoulder AAROM, manual therapy, minimize shoulder hiking, periscapular strengthening,    PT Home Exercise  Plan Standing R shoulder F and scaption with RTB; standing R shoulder abduction; Standing ER/IR with towel roll and RTB   Consulted and Agree with Plan of Care Patient      Patient will benefit from skilled therapeutic intervention in order to improve the following deficits and impairments:  Decreased activity tolerance, Decreased endurance, Decreased range of motion, Decreased safety awareness, Decreased strength, Hypomobility, Impaired UE functional use, Pain, Impaired sensation  Visit Diagnosis: Abnormal posture  Chronic right shoulder pain     Problem List Patient Active Problem List   Diagnosis Date Noted  . Full thickness rotator cuff tear 03/09/2016   Tilman Neat, SPT  This entire session was performed under direct supervision and direction of a licensed therapist/therapist assistant . I have personally read, edited and approve of the note as written.  Collie Siad PT, DPT 08/10/2016, 11:52 AM  [place] MAIN [facility] SERVICES [address] [place], [place], [address] Phone: [phone]   Fax:  [phone]  Name: Veronica Olsen MRN: [id number] Date of Birth: [birth year]

## 2016-08-15 ENCOUNTER — Encounter: Payer: Self-pay | Admitting: Physical Therapy

## 2016-08-15 ENCOUNTER — Ambulatory Visit: Payer: PPO | Admitting: Physical Therapy

## 2016-08-15 DIAGNOSIS — M25511 Pain in right shoulder: Principal | ICD-10-CM

## 2016-08-15 DIAGNOSIS — R293 Abnormal posture: Secondary | ICD-10-CM

## 2016-08-15 DIAGNOSIS — G8929 Other chronic pain: Secondary | ICD-10-CM

## 2016-08-15 NOTE — Therapy (Signed)
[place] MAIN [facility] SERVICES [address] [place], [place], [address] Phone: [phone]   Fax:  [phone]  Physical Therapy Treatment/Progress Note  Patient Details  Name: Veronica Olsen MRN: [id number] Date of Birth: 01-25-1941 No Data Recorded  Encounter Date: 08/15/2016      PT End of Session - 08/15/16 0951    Visit Number 42   Number of Visits 56   Date for PT Re-Evaluation 09/13/16   Authorization Type 6/10   PT Start Time 0928   PT Stop Time 1012   PT Time Calculation (min) 44 min   Activity Tolerance Patient tolerated treatment well   Behavior During Therapy [facility] for tasks assessed/performed      History reviewed. No pertinent past medical history.  Past Surgical History:  Procedure Laterality Date  . APPENDECTOMY     AGE 49  . SHOULDER ARTHROSCOPY WITH OPEN ROTATOR CUFF REPAIR AND DISTAL CLAVICLE ACROMINECTOMY Right 03/09/2016   Procedure: SHOULDER ARTHROSCOPY WITH OPEN ROTATOR CUFF REPAIR AND DISTAL CLAVICLE ACROMINECTOMY;  Surgeon: Thornton Park, MD;  Location: [facility] ORS;  Service: Orthopedics;  Laterality: Right;    There were no vitals filed for this visit.      Subjective Assessment - 08/15/16 0926    Subjective Patient reports she is doing well. She states her HEP progression are going fine.   Diagnostic tests MRI   Patient Stated Goals Pt reports she would like to raise her arm overhead   Currently in Pain? No/denies     Treatment:  Manual: SPT performed PROM into flexion, abduction, ext, IR and ER, x5 into each motion, min VCs for patient to relax more fully. MET in 90/90 position to R GH joint intoER, x 5pushes with relaxation in between and passive stretching;notable change in active ER in after MET  MET in 45, 60, and 80 degrees of ABD to R GH into ADD x5 with following stretch into ABD  Measurements: Supine active ER: 30 degrees Supine active IR: 45 limited by stomach  Above measures in  60-70 degrees of shoulder ABD and supported by towel at distal elbow Supine active ABD: 100 degrees Seated active flexion: 132 degrees Standing active flexion: 135 degrees  QuickDash: 11.4%, improved from 22.7% on 06/19/16, the lower the score the less the disability  Instructed patient that goals were close to being met, but that continuing to address her strength deficits would be ideal. She notes that she is tired of coming but will come for 3 more visits until MD visit. Will progress HEP in order for patient to continue becoming independent in strengthening.  Standing R shoulder flexion, 2x8 with 1.75#, min VCs to control movement Standing R shoulder scaption, 2x10, 1#, improved upper trap control, min VCs to perform exercise slowly  Standing R shoulder ABD with short lever arm and ER shoulder, 3x5, very light support from SPT, min VCs for initial set up and to not move into pulling                            PT Education - 08/15/16 0951    Education provided Yes   Education Details POC, continuing therapy and HEP   Person(s) Educated Patient   Methods Explanation   Comprehension Verbalized understanding             PT Long Term Goals - 08/15/16 0948      PT LONG TERM GOAL #1  Title pt will demonstrate at least 140 deg R shoulder PROM to return to funciton   Baseline 148 (05/22/16),   Time 4   Period Weeks   Status Achieved     PT LONG TERM GOAL #2   Title pt will be independent and compliant with HEP   Time 4   Period Weeks   Status On-going     PT LONG TERM GOAL #3   Title pt will demonstrate 140 deg of R shoulder flexion AROM to return to ADLs   Baseline 90 flex, 74 abd, 47 IR, 38 ER, 06/19/16: 132 sitting, 135 standing, ER 30 (08/15/16)   Time 4   Period Weeks   Status Partially Met     PT LONG TERM GOAL #4   Title pt will be independent and compliant with progressive strengthening program to return to PLOF   Time 4   Period Weeks    Status Partially Met     PT LONG TERM GOAL #5   Title pt will be able to improve shoulder IR to 45 deg to be able to fasten her bra   Baseline 06/19/16: 68 degrees   Time 12   Period Weeks   Status Achieved     PT LONG TERM GOAL #6   Title pt will have improved QuickDASH score by >8 points to demonstrate improved overall function.   Baseline 38% (7/31), 06/19/16: 22.73%,    Time 8   Period Weeks   Status Achieved     PT LONG TERM GOAL #7   Title Pt will have improved QuickDASH score by >8 points to demonstrate improved overall function.   Baseline 06/19/16: 22.73%, 11.4 on 08/16/16   Time 6   Period Weeks   Status Achieved               Plan - 08/15/16 1740    Clinical Impression Statement Patient continues to demonstrate improvement towards her goals. With improved shoulder flexion range, QuickDash, and compliance with home strengthening program. Today with stretching, she had mild increase in tightness which improved after METs to IR and ADD. Patient able to tolerate progression to short lever arm ABD in standing. Instructed patient in [place], patient states she is ready to be done with therapy since it has been a long process. She was instructed that she could continue to improve in order to make her function better. She agrees and states she is willing to come for 3 more sessions until seen by Dr. Mack Guise and then she will make a decision about continuing therapy. She woulc beenfit from continued skilled PT in order to address deficits in range in ER/IR and ABD as well as improve R shoulder strength to improve ability to perform ADLs.    Rehab Potential Good   Clinical Impairments Affecting Rehab Potential age, complexity of surgery   PT Frequency 2x / week   PT Duration 4 weeks   PT Treatment/Interventions ADLs/Self Care Home Management;Aquatic Therapy;Cryotherapy;Electrical Stimulation;Moist Heat;Ultrasound;Patient/family education;Therapeutic exercise;Therapeutic  activities;Functional mobility training;Manual techniques;Dry needling;Passive range of motion;Taping   PT Next Visit Plan Consider adding standing forward punch to HEP.  Continue with R shoulder AAROM, manual therapy, minimize shoulder hiking, periscapular strengthening,    PT Home Exercise Plan Standing R shoulder F and scaption with RTB; standing R shoulder abduction; Standing ER/IR with towel roll and RTB   Consulted and Agree with Plan of Care Patient      Patient will benefit from skilled therapeutic intervention in order to  improve the following deficits and impairments:  Decreased activity tolerance, Decreased endurance, Decreased range of motion, Decreased safety awareness, Decreased strength, Hypomobility, Impaired UE functional use, Pain, Impaired sensation  Visit Diagnosis: Chronic right shoulder pain - Plan: PT plan of care cert/re-cert  Abnormal posture - Plan: PT plan of care cert/re-cert     Problem List Patient Active Problem List   Diagnosis Date Noted  . Full thickness rotator cuff tear 03/09/2016   Tilman Neat, SPT This entire session was performed under direct supervision and direction of a licensed therapist/therapist assistant . I have personally read, edited and approve of the note as written.  Trotter,Margaret PT, DPT 08/16/2016, 9:02 AM  [place] MAIN [facility] SERVICES [address] [place], [place], [address] Phone: [phone]   Fax:  [phone]  Name: Veronica Olsen MRN: [id number] Date of Birth: 03/05/1941

## 2016-08-17 ENCOUNTER — Encounter: Payer: Self-pay | Admitting: Physical Therapy

## 2016-08-17 ENCOUNTER — Ambulatory Visit: Payer: PPO | Admitting: Physical Therapy

## 2016-08-17 DIAGNOSIS — M25511 Pain in right shoulder: Secondary | ICD-10-CM | POA: Diagnosis not present

## 2016-08-17 DIAGNOSIS — R293 Abnormal posture: Secondary | ICD-10-CM

## 2016-08-17 DIAGNOSIS — G8929 Other chronic pain: Secondary | ICD-10-CM

## 2016-08-17 NOTE — Therapy (Signed)
[place] MAIN [facility] SERVICES [address] [place], [place], [address] Phone: [phone]   Fax:  [phone]  Physical Therapy Treatment  Patient Details  Name: Veronica Olsen MRN: [id number] Date of Birth: 1941/07/16 No Data Recorded  Encounter Date: 08/17/2016      PT End of Session - 08/17/16 1017    Visit Number 43   Number of Visits 56   Date for PT Re-Evaluation 09/13/16   Authorization Type 7/10   PT Start Time 0930   PT Stop Time 1014   PT Time Calculation (min) 44 min   Activity Tolerance Patient tolerated treatment well   Behavior During Therapy [facility] for tasks assessed/performed      History reviewed. No pertinent past medical history.  Past Surgical History:  Procedure Laterality Date  . APPENDECTOMY     AGE 65  . SHOULDER ARTHROSCOPY WITH OPEN ROTATOR CUFF REPAIR AND DISTAL CLAVICLE ACROMINECTOMY Right 03/09/2016   Procedure: SHOULDER ARTHROSCOPY WITH OPEN ROTATOR CUFF REPAIR AND DISTAL CLAVICLE ACROMINECTOMY;  Surgeon: Thornton Park, MD;  Location: [facility] ORS;  Service: Orthopedics;  Laterality: Right;    There were no vitals filed for this visit.      Subjective Assessment - 08/17/16 1016    Subjective Patient notes she is doing well. She states her HEP is getting a little easier. She reports mild soreness after last session   Diagnostic tests MRI   Patient Stated Goals Pt reports she would like to raise her arm overhead   Currently in Pain? No/denies      Treatment: SPT performed PROM into flexion, abduction, ext, IR and ER, x10 into each motion, min VCs for patient to relax more fully. MET in 90/90 position to R GH joint intoER, x 5pushes with relaxation in between and passive stretching;again patient continues to show improved ER after MET MET in 45 and 60 degrees of ABD to R GH into ADD x5 with following stretch into ABD, improved ABD range after MET  TherEx; HEP Progressed: ER with towel assist,  3x20 seconds, min VCs to perform greater RUE ER than ABD if possible, instructed not to perform into pain or discomfort IR without towel assist, hand behind back motion, 2x20 seconds, patient notes mild pulling, instructed to decrease stretch by moving hand towards bottom; instructed that no pulling, pain, or discomfort shoulder be present but a mild stretch Standing ABD with short lever arm, no weight, min VCs to perform with elbow bent and ER (thumb pointing upwards), instructed not to perform into pulling but in a smaller range of motion.    Instructed patient that if any of these exercises were painful to d/c and wait until next session to notify physical therapist; encouraged to call if questions arise. Sidelying ER, 3# weight, towel at distal elbow to encourage slight ABD, 2x8, min VCs to rest if muscle becomes fatigued Standing ABD with short lever arm and no weight, 3x5, min VCs to maintain ER and decrease range to perform without pulling Forward flexion with 1.75#, 2x8, min VCs to decrease upper trap activation and decrease range slightly  Scapular retraction, 3x10, and low rows, 2x10, with Matrix machine cable rows, 7.5#, min VCs to squeeze scapula in correct motion Seated IR with shoulder supported by mat table in 70 degrees of ABD, with red tband resistance, 2x10, min VCs to decrease amount of hold to 1-2 seconds Dynamic stabilization in standing, OKC, 2x10 perturbations at distal humerus, min VCs to set scapula before  perturbations begin Patient denies pain or irritation throughout session.                            PT Education - 08/17/16 1017    Education provided Yes   Education Details progresion of HEP   Person(s) Educated Patient   Methods Explanation   Comprehension Verbalized understanding             PT Long Term Goals - 08/15/16 0948      PT LONG TERM GOAL #1   Title pt will demonstrate at least 140 deg R shoulder PROM to return to  funciton   Baseline 148 (05/22/16),   Time 4   Period Weeks   Status Achieved     PT LONG TERM GOAL #2   Title pt will be independent and compliant with HEP   Time 4   Period Weeks   Status On-going     PT LONG TERM GOAL #3   Title pt will demonstrate 140 deg of R shoulder flexion AROM to return to ADLs   Baseline 90 flex, 74 abd, 47 IR, 38 ER, 06/19/16: 132 sitting, 135 standing, ER 30 (08/15/16)   Time 4   Period Weeks   Status Partially Met     PT LONG TERM GOAL #4   Title pt will be independent and compliant with progressive strengthening program to return to PLOF   Time 4   Period Weeks   Status Partially Met     PT LONG TERM GOAL #5   Title pt will be able to improve shoulder IR to 45 deg to be able to fasten her bra   Baseline 06/19/16: 68 degrees   Time 12   Period Weeks   Status Achieved     PT LONG TERM GOAL #6   Title pt will have improved QuickDASH score by >8 points to demonstrate improved overall function.   Baseline 38% (7/31), 06/19/16: 22.73%,    Time 8   Period Weeks   Status Achieved     PT LONG TERM GOAL #7   Title Pt will have improved QuickDASH score by >8 points to demonstrate improved overall function.   Baseline 06/19/16: 22.73%, 11.4 on 08/16/16   Time 6   Period Weeks   Status Achieved               Plan - 08/17/16 1018    Clinical Impression Statement Patient is excited about progression of HEP. The progression includes, internal and external rotators stretching with/without towel assist and shoulder ABD with short lever arm. During activities she was able to tolerate an increase in weight with decreased number of reps and min VCs for improvement in form. The patient would continue to benefit from skilled PT in order to address RUE motion, improve strength, and her ability to perform ADLs.   Rehab Potential Good   Clinical Impairments Affecting Rehab Potential age, complexity of surgery   PT Frequency 2x / week   PT Duration 4 weeks    PT Treatment/Interventions ADLs/Self Care Home Management;Aquatic Therapy;Cryotherapy;Electrical Stimulation;Moist Heat;Ultrasound;Patient/family education;Therapeutic exercise;Therapeutic activities;Functional mobility training;Manual techniques;Dry needling;Passive range of motion;Taping   PT Next Visit Plan Consider adding standing forward punch to HEP.  Continue with R shoulder AAROM, manual therapy, minimize shoulder hiking, periscapular strengthening,    PT Home Exercise Plan Standing R shoulder F and scaption with RTB; standing R shoulder abduction; Standing ER/IR with towel roll and RTB  Consulted and Agree with Plan of Care Patient      Patient will benefit from skilled therapeutic intervention in order to improve the following deficits and impairments:  Decreased activity tolerance, Decreased endurance, Decreased range of motion, Decreased safety awareness, Decreased strength, Hypomobility, Impaired UE functional use, Pain, Impaired sensation  Visit Diagnosis: Chronic right shoulder pain  Abnormal posture     Problem List Patient Active Problem List   Diagnosis Date Noted  . Full thickness rotator cuff tear 03/09/2016   Tilman Neat, SPT This entire session was performed under direct supervision and direction of a licensed therapist/therapist assistant . I have personally read, edited and approve of the note as written.  Trotter,Margaret  PT, DPT 08/17/2016, 1:40 PM  [place] MAIN [facility] SERVICES [address] [place], [place], [address] Phone: [phone]   Fax:  [phone]  Name: Veronica Olsen MRN: [id number] Date of Birth: 03-Jun-1941

## 2016-08-17 NOTE — Patient Instructions (Addendum)
ROM: Towel Stretch - with Interior Rotation   ABDUCTION: Standing - Stable (Active)    Standing, arms at sides. Raise right arm out to side and up as high as possible, keeping elbow bent. Complete _2__ sets of __5_ repetitions. Perform __1_ sessions per day. Do not perform into pulling or pain.  Copyright  VHI. All rights reserved.  ROM: Towel Stretch - with Interior Rotation    Pull right arm above head by pulling towel up down other arm. Hold _20___ seconds. Repeat __2__ times per set. Do __1__ sets per session. Do __1__ sessions per day.  http://orth.exer.us/889   Copyright  VHI. All rights reserved.          Place your hand behind your back with palm facing upwards, and hold for 20 seconds. This should not be painful and should be a slight stretch not a pulling or painful stretch.  None of these exercises are painful. If anything is painful discontinue until next session and let physical therapy know.

## 2016-08-22 ENCOUNTER — Ambulatory Visit: Payer: PPO | Admitting: Physical Therapy

## 2016-08-22 ENCOUNTER — Encounter: Payer: Self-pay | Admitting: Physical Therapy

## 2016-08-22 DIAGNOSIS — G8929 Other chronic pain: Secondary | ICD-10-CM

## 2016-08-22 DIAGNOSIS — R293 Abnormal posture: Secondary | ICD-10-CM

## 2016-08-22 DIAGNOSIS — M25511 Pain in right shoulder: Secondary | ICD-10-CM | POA: Diagnosis not present

## 2016-08-22 NOTE — Therapy (Signed)
[place] MAIN [facility] SERVICES [address] [place], [place], [address] Phone: [phone]   Fax:  [phone]  Physical Therapy Treatment  Patient Details  Name: Veronica Olsen MRN: [id number] Date of Birth: [birth year] No Data Recorded  Encounter Date: 08/22/2016      PT End of Session - 08/22/16 1036    Visit Number 44   Number of Visits 56   Date for PT Re-Evaluation 09/13/16   Authorization Type 8/10   PT Start Time 0931   PT Stop Time 1013   PT Time Calculation (min) 42 min   Activity Tolerance Patient tolerated treatment well   Behavior During Therapy [facility] for tasks assessed/performed      History reviewed. No pertinent past medical history.  Past Surgical History:  Procedure Laterality Date  . APPENDECTOMY     AGE 25  . SHOULDER ARTHROSCOPY WITH OPEN ROTATOR CUFF REPAIR AND DISTAL CLAVICLE ACROMINECTOMY Right 03/09/2016   Procedure: SHOULDER ARTHROSCOPY WITH OPEN ROTATOR CUFF REPAIR AND DISTAL CLAVICLE ACROMINECTOMY;  Surgeon: Thornton Park, MD;  Location: [facility] ORS;  Service: Orthopedics;  Laterality: Right;    There were no vitals filed for this visit.      Subjective Assessment - 08/22/16 0936    Subjective Pt reports no pain today and that she has been able to do her new exercises with ease.     Diagnostic tests MRI   Patient Stated Goals Pt reports she would like to raise her arm overhead   Currently in Pain? No/denies       Treatment Manual PROM to R UE shoulder flexion/abduction/ER/IR for approx 8 mins to increase ROM and relax shoulder before therapeutic ex.   MET for R shoulder ER, 2 sets x 10 reps and gradually increasing into R in supine 90/90 position, min resistance provided by therapist into IR, min VCs to relax shoulder before performing exercise  Sidelying ER with #3, 2 sets x 10 reps, min VCs to control breathing and perform with greater eccentric control  Seated IR ball squeezes, 2 sets x 10  reps, min VCs for upright posture and 3 second isometric hold  Seated deltoid raises, 2 sets x 10, min VCs for positioning and decreased scapular elevation Standing shoulder flexion with #2, 2 sets x 10 reps, min VCs to decrease shoulder elevation and maintain proper breathing  Standing shoulder abduction with slightly bent elbow, #2, 2 sets x 10 reps, min VCs to not increase motion into pain, min VCs to keep shoulder back and down  Matrix machine  -Mid row standing with #7.5, 2 sets x 10 reps, min tactile cues for proper scapular retraction   -Straight arm extension with #7.5, 2 sets x 10 reps, min VCs for proper breathing technique and eccentric control                           PT Education - 08/22/16 0949    Education provided Yes   Education Details continuation of HEP    Person(s) Educated Patient   Methods Demonstration;Explanation;Verbal cues   Comprehension Verbalized understanding;Returned demonstration;Verbal cues required             PT Long Term Goals - 08/15/16 0948      PT LONG TERM GOAL #1   Title pt will demonstrate at least 140 deg R shoulder PROM to return to funciton   Baseline 148 (05/22/16),   Time 4   Period Weeks  Status Achieved     PT LONG TERM GOAL #2   Title pt will be independent and compliant with HEP   Time 4   Period Weeks   Status On-going     PT LONG TERM GOAL #3   Title pt will demonstrate 140 deg of R shoulder flexion AROM to return to ADLs   Baseline 90 flex, 74 abd, 47 IR, 38 ER, 06/19/16: 132 sitting, 135 standing, ER 30 (08/15/16)   Time 4   Period Weeks   Status Partially Met     PT LONG TERM GOAL #4   Title pt will be independent and compliant with progressive strengthening program to return to PLOF   Time 4   Period Weeks   Status Partially Met     PT LONG TERM GOAL #5   Title pt will be able to improve shoulder IR to 45 deg to be able to fasten her bra   Baseline 06/19/16: 68 degrees   Time 12    Period Weeks   Status Achieved     PT LONG TERM GOAL #6   Title pt will have improved QuickDASH score by >8 points to demonstrate improved overall function.   Baseline 38% (7/31), 06/19/16: 22.73%,    Time 8   Period Weeks   Status Achieved     PT LONG TERM GOAL #7   Title Pt will have improved QuickDASH score by >8 points to demonstrate improved overall function.   Baseline 06/19/16: 22.73%, 11.4 on 08/16/16   Time 6   Period Weeks   Status Achieved               Plan - 08/22/16 1036    Clinical Impression Statement Pt reports compliance with HEP and reports no increased pain or soreness since last session.  Pt has no new questions regarding HEP at home.  Added seated deltoid raises with #2, pt required min VCs to keep decrease right shoulder elevation and performed without an increase in pain.  Continued to work on ER/IR and scapular strengthening for further posterior shoulder strength development.  Pt was able to tolerate all of session today without any pain.  She would benefit from further skilled PT to increase her RC strengthening and develop full HEP to continue with after discharge.     Rehab Potential Good   Clinical Impairments Affecting Rehab Potential age, complexity of surgery   PT Frequency 2x / week   PT Duration 4 weeks   PT Treatment/Interventions ADLs/Self Care Home Management;Aquatic Therapy;Cryotherapy;Electrical Stimulation;Moist Heat;Ultrasound;Patient/family education;Therapeutic exercise;Therapeutic activities;Functional mobility training;Manual techniques;Dry needling;Passive range of motion;Taping   PT Next Visit Plan Consider adding standing forward punch to HEP.  Continue with R shoulder AAROM, manual therapy, minimize shoulder hiking, periscapular strengthening,    PT Home Exercise Plan Standing R shoulder F and scaption with RTB; standing R shoulder abduction; Standing ER/IR with towel roll and RTB   Consulted and Agree with Plan of Care Patient       Patient will benefit from skilled therapeutic intervention in order to improve the following deficits and impairments:  Decreased activity tolerance, Decreased endurance, Decreased range of motion, Decreased safety awareness, Decreased strength, Hypomobility, Impaired UE functional use, Pain, Impaired sensation  Visit Diagnosis: Chronic right shoulder pain  Abnormal posture  Right shoulder pain, unspecified chronicity     Problem List Patient Active Problem List   Diagnosis Date Noted  . Full thickness rotator cuff tear 03/09/2016   Stacy Gardner, SPT  This  entire session was performed under direct supervision and direction of a licensed Chiropractor . I have personally read, edited and approve of the note as written.  Trotter,Margaret PT, DPT 08/22/2016, 11:33 AM  [place] MAIN [facility] SERVICES [address] [place], [place], [address] Phone: [phone]   Fax:  [phone]  Name: CORTEZ STEELMAN MRN: [id number] Date of Birth: Mar 13, 1941

## 2016-08-24 ENCOUNTER — Ambulatory Visit: Payer: PPO | Attending: Orthopedic Surgery | Admitting: Physical Therapy

## 2016-08-24 ENCOUNTER — Encounter: Payer: Self-pay | Admitting: Physical Therapy

## 2016-08-24 DIAGNOSIS — R293 Abnormal posture: Secondary | ICD-10-CM | POA: Insufficient documentation

## 2016-08-24 DIAGNOSIS — M25511 Pain in right shoulder: Secondary | ICD-10-CM | POA: Diagnosis not present

## 2016-08-24 NOTE — Therapy (Signed)
[place] MAIN [facility] SERVICES [address] [place], [place], [address] Phone: [phone]   Fax:  [phone]  Physical Therapy Treatment/Progress Note  Patient Details  Name: Veronica Olsen MRN: [id number] Date of Birth: 12/09/1940 No Data Recorded  Encounter Date: 08/24/2016      PT End of Session - 08/24/16 0954    Visit Number 45   Number of Visits 56   Date for PT Re-Evaluation 09/13/16   Authorization Type 9/10   PT Start Time 0936   PT Stop Time 1015   PT Time Calculation (min) 39 min   Activity Tolerance Patient tolerated treatment well   Behavior During Therapy [facility] for tasks assessed/performed      History reviewed. No pertinent past medical history.  Past Surgical History:  Procedure Laterality Date  . APPENDECTOMY     AGE 75  . SHOULDER ARTHROSCOPY WITH OPEN ROTATOR CUFF REPAIR AND DISTAL CLAVICLE ACROMINECTOMY Right 03/09/2016   Procedure: SHOULDER ARTHROSCOPY WITH OPEN ROTATOR CUFF REPAIR AND DISTAL CLAVICLE ACROMINECTOMY;  Surgeon: Thornton Park, MD;  Location: [facility] ORS;  Service: Orthopedics;  Laterality: Right;    There were no vitals filed for this visit.      Subjective Assessment - 08/24/16 0952    Subjective Patient denies pain. She states her HEP is good and extensive enough to keep up with gains made in therapy. She reports mild soreness after last session.    Diagnostic tests MRI   Patient Stated Goals Pt reports she would like to raise her arm overhead   Currently in Pain? No/denies            [facility] PT Assessment - 08/24/16 0001      AROM   Right Shoulder Flexion 147 Degrees  supine, 133 degrees standing   Right Shoulder ABduction 90 Degrees  Supine, 87 degrees standing   Right Shoulder External Rotation 30 Degrees     Treatment:  PROM warm up into R shoulder flexion, abduction, and ER, x10 each, min VCs to continue relaxation MET for R GH joint ER, x5, with arm in 60 degrees ABD, min  VCs to relax in order to improve stretch, patient able to get greater ER after MET  Measurements taken in supine and standing as listed above, patient has improved R shoulder flexion from last measurement but slightly decrease shoulder ABD ER in seated, the patient can reach to her lower neck   Standing forward flexion and scaption, 2x10, 2#, min VCs to decrease upper trap activation throughout Matrix scapular retraction and low rows, 7.5# weight, min VCs for initial positioning and continue to use good mechanics with decreased upper trap movements Standing abduction with mild bent elbow and slight ER, 1# weight, 2x10, min VCs for initial set and continued ER, patient appears to have improved ABD range due to performing with arm in slight ER Standing ER against yellow tband in up/out and down/out plane, 3x5 min VCs to decrease upward movement in order to just perform ER and not shoulder ABD IR isometrics with pressure against ball, 2x10, min VCs to increase IR movement and not just shoulder extension Standing dynamic stabilization, x10 perturbation to proximal forearm, min VCs to set scapula first  Instructed to perform towel slide outs in order to increase forward reaching ease at home.                          PT Education - 08/24/16 6837  Education provided Yes   Education Details POC. continuation of HEP   Person(s) Educated Patient   Methods Explanation   Comprehension Verbalized understanding             PT Long Term Goals - 08/15/16 0948      PT LONG TERM GOAL #1   Title pt will demonstrate at least 140 deg R shoulder PROM to return to funciton   Baseline 148 (05/22/16),   Time 4   Period Weeks   Status Achieved     PT LONG TERM GOAL #2   Title pt will be independent and compliant with HEP   Time 4   Period Weeks   Status On-going     PT LONG TERM GOAL #3   Title pt will demonstrate 140 deg of R shoulder flexion AROM to return to ADLs    Baseline 90 flex, 74 abd, 47 IR, 38 ER, 06/19/16: 132 sitting, 135 standing, ER 30 (08/15/16)   Time 4   Period Weeks   Status Partially Met     PT LONG TERM GOAL #4   Title pt will be independent and compliant with progressive strengthening program to return to PLOF   Time 4   Period Weeks   Status Partially Met     PT LONG TERM GOAL #5   Title pt will be able to improve shoulder IR to 45 deg to be able to fasten her bra   Baseline 06/19/16: 68 degrees   Time 12   Period Weeks   Status Achieved     PT LONG TERM GOAL #6   Title pt will have improved QuickDASH score by >8 points to demonstrate improved overall function.   Baseline 38% (7/31), 06/19/16: 22.73%,    Time 8   Period Weeks   Status Achieved     PT LONG TERM GOAL #7   Title Pt will have improved QuickDASH score by >8 points to demonstrate improved overall function.   Baseline 06/19/16: 22.73%, 11.4 on 08/16/16   Time 6   Period Weeks   Status Achieved               Plan - 08/24/16 1118    Clinical Impression Statement The patient continues to demonstrate improved tolerance to progression of strengthening with only mild soreness the following day. She does have limited shoulder ABD which seems to be due to tightness than weakness due to the patient able to attain almost the same degree amount in supine and standing. The patient continues to require min VCs to improve exercises slightly and decrease mild upper trap activation. She has almost met her therapy goals and reports she does not feel limited in her ADLs except in forward reaching like turning the faucet on and a movement including HABD and shoulder extension/ER. The patient is compliant with her HEP and will continue to perform exericses at home. She is unsure whether she wants to have more visits and will be consulting her MD on 08/29/16. The patient may be able to benefit from continued strengthening exercises and stretching to increase shoulder motion.      Rehab Potential Good   Clinical Impairments Affecting Rehab Potential age, complexity of surgery   PT Frequency 2x / week   PT Duration 4 weeks   PT Treatment/Interventions ADLs/Self Care Home Management;Aquatic Therapy;Cryotherapy;Electrical Stimulation;Moist Heat;Ultrasound;Patient/family education;Therapeutic exercise;Therapeutic activities;Functional mobility training;Manual techniques;Dry needling;Passive range of motion;Taping   PT Next Visit Plan Consider adding standing forward punch to HEP.  Continue  with R shoulder AAROM, manual therapy, minimize shoulder hiking, periscapular strengthening,    PT Home Exercise Plan Standing R shoulder F and scaption with RTB; standing R shoulder abduction; Standing ER/IR with towel roll and RTB   Consulted and Agree with Plan of Care Patient      Patient will benefit from skilled therapeutic intervention in order to improve the following deficits and impairments:  Decreased activity tolerance, Decreased endurance, Decreased range of motion, Decreased safety awareness, Decreased strength, Hypomobility, Impaired UE functional use, Pain, Impaired sensation  Visit Diagnosis: Right shoulder pain, unspecified chronicity  Abnormal posture     Problem List Patient Active Problem List   Diagnosis Date Noted  . Full thickness rotator cuff tear 03/09/2016   Tilman Neat, SPT This entire session was performed under direct supervision and direction of a licensed therapist/therapist assistant . I have personally read, edited and approve of the note as written.  Trotter,Margaret PT, DPT 08/24/2016, 1:22 PM  [place] MAIN [facility] SERVICES [address] [place], [place], [address] Phone: [phone]   Fax:  [phone]  Name: MAAYAN JENNING MRN: [id number] Date of Birth: [birth year]

## 2016-08-28 DIAGNOSIS — G5601 Carpal tunnel syndrome, right upper limb: Secondary | ICD-10-CM | POA: Diagnosis not present

## 2016-08-30 ENCOUNTER — Encounter: Payer: PPO | Admitting: Physical Therapy

## 2016-09-06 ENCOUNTER — Encounter: Payer: PPO | Admitting: Physical Therapy

## 2016-09-06 ENCOUNTER — Encounter: Payer: Self-pay | Admitting: Physical Therapy

## 2016-09-06 DIAGNOSIS — R293 Abnormal posture: Secondary | ICD-10-CM

## 2016-09-06 DIAGNOSIS — M25511 Pain in right shoulder: Secondary | ICD-10-CM

## 2016-09-06 NOTE — Therapy (Signed)
[place] MAIN [facility] SERVICES [address] [place], [place], [address] Phone: [phone]   Fax:  [phone]  September 06, 2016   _0 @  Physical Therapy Discharge Summary  Patient: Veronica Olsen  MRN: [id number]  Date of Birth: 1941/04/06   Diagnosis:  Right shoulder pain, unspecified chronicity  Abnormal posture Referring Provider: Mack Guise, MD  The above patient had been seen in Physical Therapy 45 times of 56 treatments scheduled. Last appointment was on 08/24/16.  The treatment consisted of manual therapy, therapeutic exercise, modalities prn, heat/ice;  The patient is: Improved  Subjective: Patient failed to attend last visit scheduled. She went to see MD on 08/29/16 and with his approval has decided to stop therapy. She has had extensive PT which has helped her gain some ROM and strength. However she continues to have weakness. Patient has an extensive HEP to continue to work towards PLOF.    Functional Status at Discharge: Please see last progress note on 08/24/16      PT Long Term Goals - 08/15/16 0948      PT LONG TERM GOAL #1   Title pt will demonstrate at least 140 deg R shoulder PROM to return to funciton   Baseline 148 (05/22/16),   Time 4   Period Weeks   Status Achieved     PT LONG TERM GOAL #2   Title pt will be independent and compliant with HEP   Time 4   Period Weeks   Status On-going     PT LONG TERM GOAL #3   Title pt will demonstrate 140 deg of R shoulder flexion AROM to return to ADLs   Baseline 90 flex, 74 abd, 47 IR, 38 ER, 06/19/16: 132 sitting, 135 standing, ER 30 (08/15/16)   Time 4   Period Weeks   Status Partially Met     PT LONG TERM GOAL #4   Title pt will be independent and compliant with progressive strengthening program to return to PLOF   Time 4   Period Weeks   Status Partially Met     PT LONG TERM GOAL #5   Title pt will be able to improve shoulder IR to 45 deg to be  able to fasten her bra   Baseline 06/19/16: 68 degrees   Time 12   Period Weeks   Status Achieved     PT LONG TERM GOAL #6   Title pt will have improved QuickDASH score by >8 points to demonstrate improved overall function.   Baseline 38% (7/31), 06/19/16: 22.73%,    Time 8   Period Weeks   Status Achieved     PT LONG TERM GOAL #7   Title Pt will have improved QuickDASH score by >8 points to demonstrate improved overall function.   Baseline 06/19/16: 22.73%, 11.4 on 08/16/16   Time 6   Period Weeks   Status Achieved      Goals Partially Met    Sincerely,   Foch Rosenwald, PT, DPT   CC _1 @  [place] [address] [place], [place], [address] Phone: [phone]   Fax:  [phone]  Patient: Veronica Olsen  MRN: [id number]  Date of Birth: Mar 20, 1941

## 2016-09-13 ENCOUNTER — Encounter: Payer: PPO | Admitting: Physical Therapy

## 2016-09-19 ENCOUNTER — Encounter: Payer: PPO | Admitting: Physical Therapy

## 2016-09-25 ENCOUNTER — Encounter: Payer: PPO | Admitting: Physical Therapy

## 2016-10-02 ENCOUNTER — Encounter: Payer: PPO | Admitting: Physical Therapy

## 2016-10-25 DIAGNOSIS — M75121 Complete rotator cuff tear or rupture of right shoulder, not specified as traumatic: Secondary | ICD-10-CM | POA: Diagnosis not present

## 2017-11-24 IMAGING — MR MR SHOULDER*R* W/O CM
5 series · 40 of 40 positions shown · non-contrast
Comparison: 11/25/2015

CLINICAL DATA: Right shoulder pain after a fall. Limited range of
motion.

EXAM:
MRI OF THE RIGHT SHOULDER WITHOUT CONTRAST
TECHNIQUE: Multiplanar, multisequence MR imaging of the shoulder was performed.
No intravenous contrast was administered.

[Series 4: T2 fat-sat · oblique · 4.0mm · 0.55mm/px · 6 of 17 slices shown (1 of 3)]
[im 1/17]
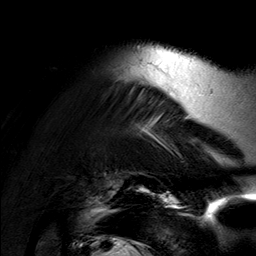
[im 4/17]
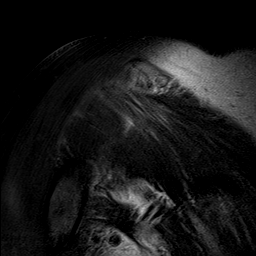
[im 7/17]
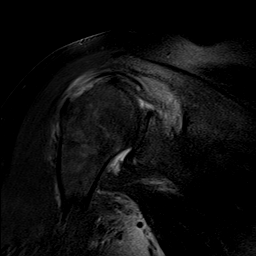
[im 10/17]
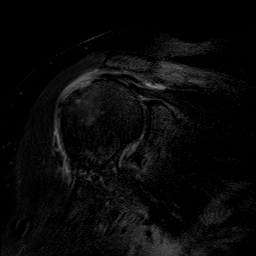
[im 13/17]
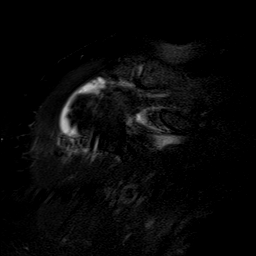
[im 17/17]
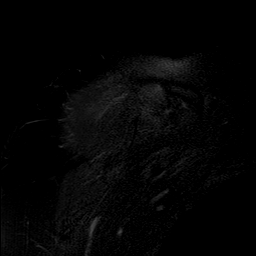

[Series 5: T2 fat-sat · axial · 4.0mm · 0.47mm/px · z∈[-12,+71]mm · 9 of 20 slices shown (2 of 3)]
[im 1/20]
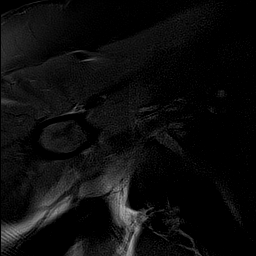
[im 3/20]
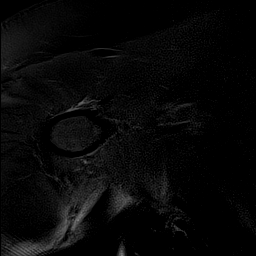
[im 5/20]
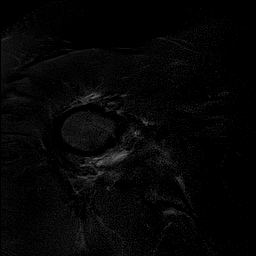
[im 8/20]
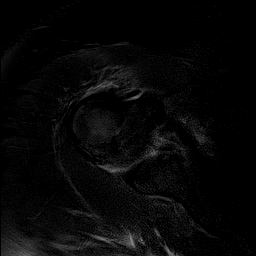
[im 10/20]
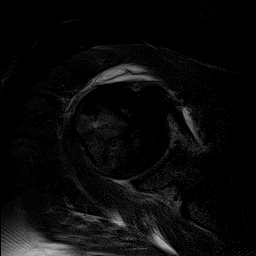
[im 12/20]
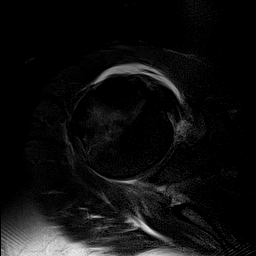
[im 15/20]
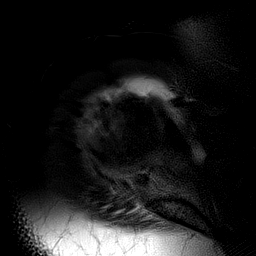
[im 17/20]
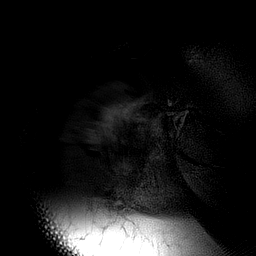
[im 20/20]
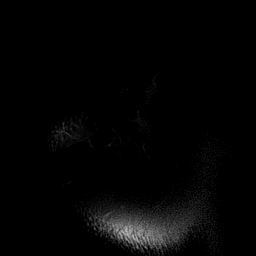

[Series 6: PD · oblique · 4.0mm · 0.62mm/px · 7 of 17 slices shown]
[im 1/17]
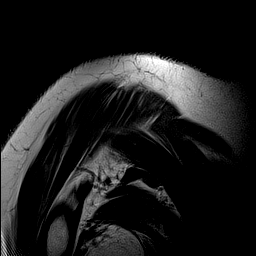
[im 3/17]
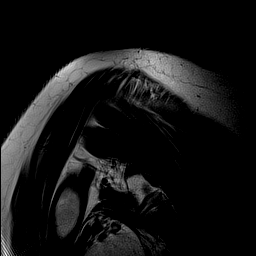
[im 6/17]
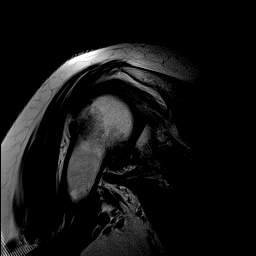
[im 9/17]
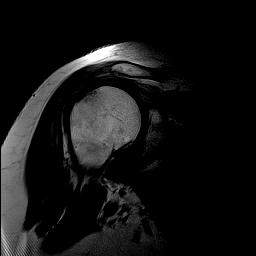
[im 11/17]
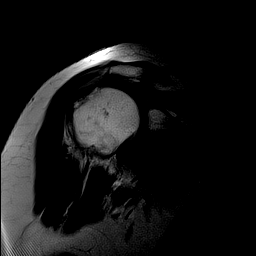
[im 14/17]
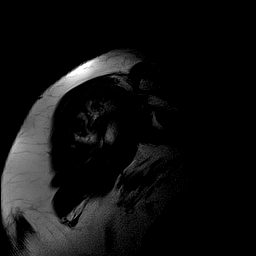
[im 17/17]
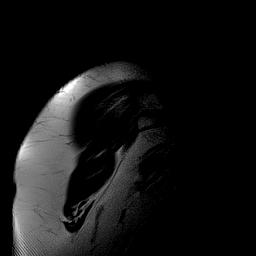

[Series 7: T1 · oblique · 4.0mm · 0.55mm/px · 9 of 20 slices shown]
[im 1/20]
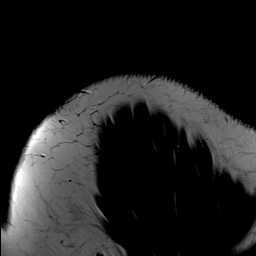
[im 3/20]
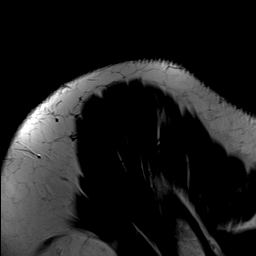
[im 5/20]
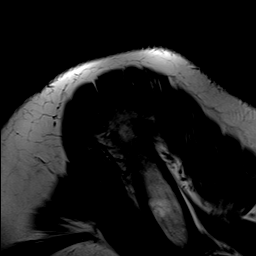
[im 8/20]
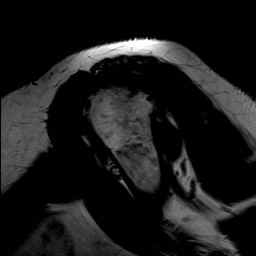
[im 10/20]
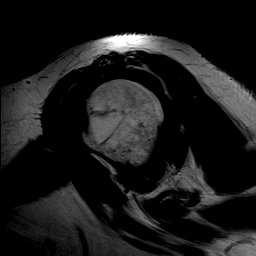
[im 12/20]
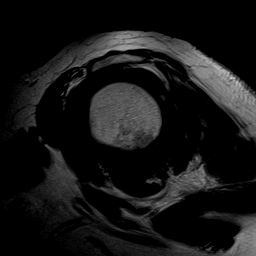
[im 15/20]
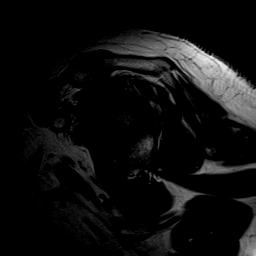
[im 17/20]
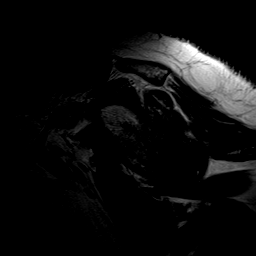
[im 20/20]
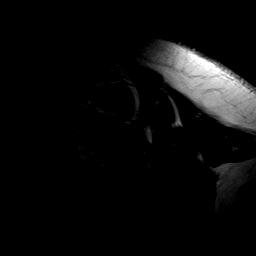

[Series 8: T2 fat-sat · oblique · 4.0mm · 0.55mm/px · 9 of 20 slices shown (3 of 3)]
[im 1/20]
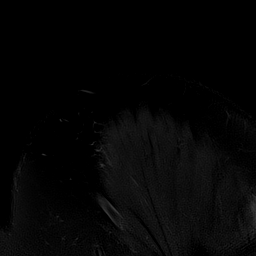
[im 3/20]
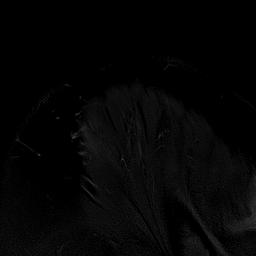
[im 5/20]
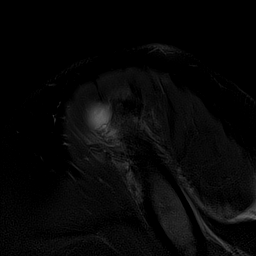
[im 8/20]
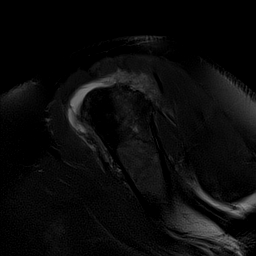
[im 10/20]
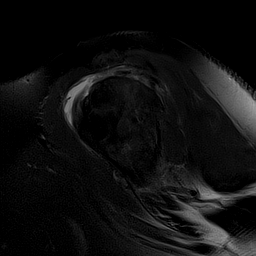
[im 12/20]
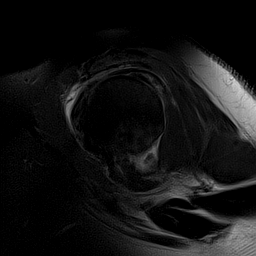
[im 15/20]
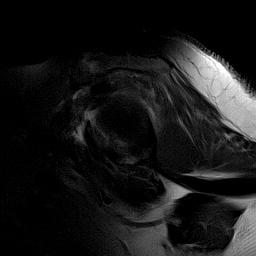
[im 17/20]
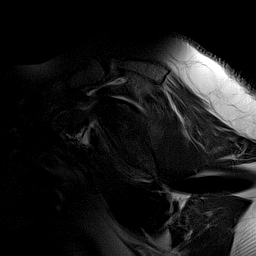
[im 20/20]
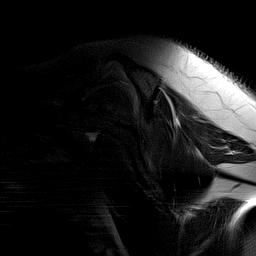

[40 of 40 positions shown; findings below may reference images not displayed]

FINDINGS: Despite efforts by the technologist and patient, motion artifact is
present on today's exam and could not be eliminated. This reduces
exam sensitivity and specificity.

Rotator cuff: Full-thickness full width rupture of the supraspinatus
tendon retracted 2 cm.

Advanced infraspinatus tendinopathy distally, potentially with
partial tearing along the articular surface.

Mild subscapularis tendinopathy.

Muscles: Low-level edema in the infraspinatus muscle distally. No
muscle atrophy.

Biceps long head: Moderate tendinopathy of the intra-articular
segment.

Acromioclavicular Joint: As expected there is fluid in the
subacromial subdeltoid bursa. Mild degenerative AC joint
arthropathy. Subacromial morphology is type 2 (curved).

Glenohumeral Joint: Small joint effusion communicates with the
bursa. Moderate to prominent glenohumeral degenerative chondral
thinning. Subcortical edema inferiorly in the glenoid, likely
degenerative.

Labrum: Small and degenerated superior labrum, diminutive size
raises the possibility of otherwise occult labral tear.

Bones: No significant extra-articular osseous abnormalities
identified.
IMPRESSION: 1. Full-thickness full width rupture of the supraspinatus tendon
retracted back 2 cm without supraspinatus edema or atrophy.
2. Advanced infraspinatus tendinopathy distally potentially with
partial thickness articular surface tearing, and with low-level
edema in the infraspinatus muscle favoring muscle strain.
3. Mild subscapularis tendinopathy.  Moderate biceps tendinopathy.
4. Small joint effusion communicates with the subacromial subdeltoid
bursa.
5. Moderate to prominent degenerative glenohumeral chondral
thinning. Mild degenerative AC joint arthropathy.
6. Blunted and small superior labrum may be a secondary finding of
underlying labral tear.

## 2018-04-12 DIAGNOSIS — L814 Other melanin hyperpigmentation: Secondary | ICD-10-CM | POA: Diagnosis not present

## 2018-04-12 DIAGNOSIS — L821 Other seborrheic keratosis: Secondary | ICD-10-CM | POA: Diagnosis not present

## 2018-04-12 DIAGNOSIS — L219 Seborrheic dermatitis, unspecified: Secondary | ICD-10-CM | POA: Diagnosis not present

## 2018-05-13 DIAGNOSIS — H353131 Nonexudative age-related macular degeneration, bilateral, early dry stage: Secondary | ICD-10-CM | POA: Diagnosis not present

## 2019-05-23 ENCOUNTER — Other Ambulatory Visit: Payer: Self-pay

## 2020-02-23 DIAGNOSIS — Z6841 Body Mass Index (BMI) 40.0 and over, adult: Secondary | ICD-10-CM | POA: Diagnosis not present

## 2020-02-23 DIAGNOSIS — M170 Bilateral primary osteoarthritis of knee: Secondary | ICD-10-CM | POA: Diagnosis not present

## 2020-02-23 DIAGNOSIS — Z136 Encounter for screening for cardiovascular disorders: Secondary | ICD-10-CM | POA: Diagnosis not present

## 2020-02-23 DIAGNOSIS — R0601 Orthopnea: Secondary | ICD-10-CM | POA: Diagnosis not present

## 2020-02-23 DIAGNOSIS — Z1331 Encounter for screening for depression: Secondary | ICD-10-CM | POA: Diagnosis not present

## 2020-02-23 DIAGNOSIS — R600 Localized edema: Secondary | ICD-10-CM | POA: Diagnosis not present

## 2020-02-23 DIAGNOSIS — R0602 Shortness of breath: Secondary | ICD-10-CM | POA: Diagnosis not present

## 2020-03-09 DIAGNOSIS — Z139 Encounter for screening, unspecified: Secondary | ICD-10-CM | POA: Diagnosis not present

## 2020-03-09 DIAGNOSIS — R600 Localized edema: Secondary | ICD-10-CM | POA: Diagnosis not present

## 2020-03-09 DIAGNOSIS — Z6841 Body Mass Index (BMI) 40.0 and over, adult: Secondary | ICD-10-CM | POA: Diagnosis not present

## 2020-04-12 DIAGNOSIS — H353132 Nonexudative age-related macular degeneration, bilateral, intermediate dry stage: Secondary | ICD-10-CM | POA: Diagnosis not present

## 2020-05-21 DIAGNOSIS — H02826 Cysts of left eye, unspecified eyelid: Secondary | ICD-10-CM | POA: Diagnosis not present

## 2020-06-02 DIAGNOSIS — H02826 Cysts of left eye, unspecified eyelid: Secondary | ICD-10-CM | POA: Diagnosis not present

## 2020-09-09 DIAGNOSIS — R600 Localized edema: Secondary | ICD-10-CM | POA: Diagnosis not present

## 2020-09-09 DIAGNOSIS — Z6841 Body Mass Index (BMI) 40.0 and over, adult: Secondary | ICD-10-CM | POA: Diagnosis not present

## 2020-09-09 DIAGNOSIS — Z9181 History of falling: Secondary | ICD-10-CM | POA: Diagnosis not present

## 2020-09-20 DIAGNOSIS — H353211 Exudative age-related macular degeneration, right eye, with active choroidal neovascularization: Secondary | ICD-10-CM | POA: Diagnosis not present

## 2020-10-25 DIAGNOSIS — H353211 Exudative age-related macular degeneration, right eye, with active choroidal neovascularization: Secondary | ICD-10-CM | POA: Diagnosis not present

## 2020-12-03 DIAGNOSIS — H04222 Epiphora due to insufficient drainage, left lacrimal gland: Secondary | ICD-10-CM | POA: Diagnosis not present

## 2020-12-03 DIAGNOSIS — H353211 Exudative age-related macular degeneration, right eye, with active choroidal neovascularization: Secondary | ICD-10-CM | POA: Diagnosis not present

## 2021-01-07 DIAGNOSIS — H353211 Exudative age-related macular degeneration, right eye, with active choroidal neovascularization: Secondary | ICD-10-CM | POA: Diagnosis not present

## 2021-02-28 DIAGNOSIS — H353211 Exudative age-related macular degeneration, right eye, with active choroidal neovascularization: Secondary | ICD-10-CM | POA: Diagnosis not present

## 2021-03-09 DIAGNOSIS — Z1331 Encounter for screening for depression: Secondary | ICD-10-CM | POA: Diagnosis not present

## 2021-03-09 DIAGNOSIS — R600 Localized edema: Secondary | ICD-10-CM | POA: Diagnosis not present

## 2021-03-09 DIAGNOSIS — L309 Dermatitis, unspecified: Secondary | ICD-10-CM | POA: Diagnosis not present

## 2021-03-09 DIAGNOSIS — Z79899 Other long term (current) drug therapy: Secondary | ICD-10-CM | POA: Diagnosis not present

## 2021-03-09 DIAGNOSIS — Z6841 Body Mass Index (BMI) 40.0 and over, adult: Secondary | ICD-10-CM | POA: Diagnosis not present

## 2021-04-29 DIAGNOSIS — H353211 Exudative age-related macular degeneration, right eye, with active choroidal neovascularization: Secondary | ICD-10-CM | POA: Diagnosis not present

## 2021-06-20 DIAGNOSIS — B370 Candidal stomatitis: Secondary | ICD-10-CM | POA: Diagnosis not present

## 2021-07-08 DIAGNOSIS — H353231 Exudative age-related macular degeneration, bilateral, with active choroidal neovascularization: Secondary | ICD-10-CM | POA: Diagnosis not present

## 2021-07-13 DIAGNOSIS — J189 Pneumonia, unspecified organism: Secondary | ICD-10-CM | POA: Diagnosis not present

## 2021-07-13 DIAGNOSIS — J209 Acute bronchitis, unspecified: Secondary | ICD-10-CM | POA: Diagnosis not present

## 2021-07-13 DIAGNOSIS — R053 Chronic cough: Secondary | ICD-10-CM | POA: Diagnosis not present

## 2021-07-25 DIAGNOSIS — J300 Vasomotor rhinitis: Secondary | ICD-10-CM | POA: Diagnosis not present

## 2021-07-25 DIAGNOSIS — K219 Gastro-esophageal reflux disease without esophagitis: Secondary | ICD-10-CM | POA: Diagnosis not present

## 2021-07-25 DIAGNOSIS — R0982 Postnasal drip: Secondary | ICD-10-CM | POA: Diagnosis not present

## 2021-07-25 DIAGNOSIS — J387 Other diseases of larynx: Secondary | ICD-10-CM | POA: Diagnosis not present

## 2021-07-25 DIAGNOSIS — R053 Chronic cough: Secondary | ICD-10-CM | POA: Diagnosis not present

## 2021-07-25 DIAGNOSIS — R070 Pain in throat: Secondary | ICD-10-CM | POA: Diagnosis not present

## 2021-08-04 DIAGNOSIS — R053 Chronic cough: Secondary | ICD-10-CM | POA: Diagnosis not present

## 2021-08-04 DIAGNOSIS — R432 Parageusia: Secondary | ICD-10-CM | POA: Diagnosis not present

## 2021-08-04 DIAGNOSIS — E46 Unspecified protein-calorie malnutrition: Secondary | ICD-10-CM | POA: Diagnosis not present

## 2021-08-04 DIAGNOSIS — Z6837 Body mass index (BMI) 37.0-37.9, adult: Secondary | ICD-10-CM | POA: Diagnosis not present

## 2021-08-29 DIAGNOSIS — R070 Pain in throat: Secondary | ICD-10-CM | POA: Diagnosis not present

## 2021-08-29 DIAGNOSIS — K219 Gastro-esophageal reflux disease without esophagitis: Secondary | ICD-10-CM | POA: Diagnosis not present

## 2021-08-29 DIAGNOSIS — R0982 Postnasal drip: Secondary | ICD-10-CM | POA: Diagnosis not present

## 2021-09-01 DIAGNOSIS — H353221 Exudative age-related macular degeneration, left eye, with active choroidal neovascularization: Secondary | ICD-10-CM | POA: Diagnosis not present

## 2021-09-01 DIAGNOSIS — H353211 Exudative age-related macular degeneration, right eye, with active choroidal neovascularization: Secondary | ICD-10-CM | POA: Diagnosis not present

## 2022-10-27 DIAGNOSIS — H353133 Nonexudative age-related macular degeneration, bilateral, advanced atrophic without subfoveal involvement: Secondary | ICD-10-CM | POA: Diagnosis not present

## 2022-10-27 DIAGNOSIS — H353211 Exudative age-related macular degeneration, right eye, with active choroidal neovascularization: Secondary | ICD-10-CM | POA: Diagnosis not present

## 2022-10-27 DIAGNOSIS — H353221 Exudative age-related macular degeneration, left eye, with active choroidal neovascularization: Secondary | ICD-10-CM | POA: Diagnosis not present

## 2022-10-30 DIAGNOSIS — H353133 Nonexudative age-related macular degeneration, bilateral, advanced atrophic without subfoveal involvement: Secondary | ICD-10-CM | POA: Diagnosis not present

## 2022-10-30 DIAGNOSIS — H353211 Exudative age-related macular degeneration, right eye, with active choroidal neovascularization: Secondary | ICD-10-CM | POA: Diagnosis not present

## 2022-10-30 DIAGNOSIS — H353221 Exudative age-related macular degeneration, left eye, with active choroidal neovascularization: Secondary | ICD-10-CM | POA: Diagnosis not present

## 2023-01-08 DIAGNOSIS — H353221 Exudative age-related macular degeneration, left eye, with active choroidal neovascularization: Secondary | ICD-10-CM | POA: Diagnosis not present

## 2023-01-08 DIAGNOSIS — H353231 Exudative age-related macular degeneration, bilateral, with active choroidal neovascularization: Secondary | ICD-10-CM | POA: Diagnosis not present

## 2023-01-08 DIAGNOSIS — H2512 Age-related nuclear cataract, left eye: Secondary | ICD-10-CM | POA: Diagnosis not present

## 2023-01-08 DIAGNOSIS — H353211 Exudative age-related macular degeneration, right eye, with active choroidal neovascularization: Secondary | ICD-10-CM | POA: Diagnosis not present

## 2023-01-08 DIAGNOSIS — H2511 Age-related nuclear cataract, right eye: Secondary | ICD-10-CM | POA: Diagnosis not present

## 2023-03-12 DIAGNOSIS — R600 Localized edema: Secondary | ICD-10-CM | POA: Diagnosis not present

## 2023-03-12 DIAGNOSIS — Z139 Encounter for screening, unspecified: Secondary | ICD-10-CM | POA: Diagnosis not present

## 2023-03-12 DIAGNOSIS — Z79899 Other long term (current) drug therapy: Secondary | ICD-10-CM | POA: Diagnosis not present

## 2023-03-12 DIAGNOSIS — Z9181 History of falling: Secondary | ICD-10-CM | POA: Diagnosis not present

## 2023-03-12 DIAGNOSIS — Z1331 Encounter for screening for depression: Secondary | ICD-10-CM | POA: Diagnosis not present

## 2023-03-12 DIAGNOSIS — E669 Obesity, unspecified: Secondary | ICD-10-CM | POA: Diagnosis not present

## 2023-03-12 DIAGNOSIS — E538 Deficiency of other specified B group vitamins: Secondary | ICD-10-CM | POA: Diagnosis not present

## 2023-03-12 DIAGNOSIS — Z6837 Body mass index (BMI) 37.0-37.9, adult: Secondary | ICD-10-CM | POA: Diagnosis not present

## 2023-03-12 DIAGNOSIS — K219 Gastro-esophageal reflux disease without esophagitis: Secondary | ICD-10-CM | POA: Diagnosis not present

## 2023-03-22 DIAGNOSIS — H353231 Exudative age-related macular degeneration, bilateral, with active choroidal neovascularization: Secondary | ICD-10-CM | POA: Diagnosis not present

## 2023-06-13 DIAGNOSIS — H353221 Exudative age-related macular degeneration, left eye, with active choroidal neovascularization: Secondary | ICD-10-CM | POA: Diagnosis not present

## 2023-06-13 DIAGNOSIS — H353231 Exudative age-related macular degeneration, bilateral, with active choroidal neovascularization: Secondary | ICD-10-CM | POA: Diagnosis not present

## 2023-06-13 DIAGNOSIS — H353211 Exudative age-related macular degeneration, right eye, with active choroidal neovascularization: Secondary | ICD-10-CM | POA: Diagnosis not present

## 2023-06-13 DIAGNOSIS — H2511 Age-related nuclear cataract, right eye: Secondary | ICD-10-CM | POA: Diagnosis not present

## 2023-06-13 DIAGNOSIS — H2512 Age-related nuclear cataract, left eye: Secondary | ICD-10-CM | POA: Diagnosis not present

## 2023-06-20 DIAGNOSIS — H2512 Age-related nuclear cataract, left eye: Secondary | ICD-10-CM | POA: Diagnosis not present

## 2023-06-20 DIAGNOSIS — H2511 Age-related nuclear cataract, right eye: Secondary | ICD-10-CM | POA: Diagnosis not present

## 2023-07-11 ENCOUNTER — Encounter: Payer: Self-pay | Admitting: Ophthalmology

## 2023-07-11 NOTE — Anesthesia Preprocedure Evaluation (Addendum)
Anesthesia Evaluation  Patient identified by MRN, date of birth, ID band Patient awake    Reviewed: Allergy & Precautions, H&P , NPO status , Patient's Chart, lab work & pertinent test results  History of Anesthesia Complications (+) PONV and history of anesthetic complications  Airway Mallampati: IV  TM Distance: <3 FB Neck ROM: Full    Dental  (+) Chipped   Pulmonary neg pulmonary ROS   Pulmonary exam normal breath sounds clear to auscultation       Cardiovascular hypertension, Normal cardiovascular exam Rhythm:Regular Rate:Normal     Neuro/Psych negative neurological ROS  negative psych ROS   GI/Hepatic Neg liver ROS,GERD  ,,  Endo/Other  negative endocrine ROS    Renal/GU negative Renal ROS  negative genitourinary   Musculoskeletal  (+) Arthritis ,    Abdominal   Peds negative pediatric ROS (+)  Hematology negative hematology ROS (+)   Anesthesia Other Findings PONV (postoperative nausea and vomiting)  Hypertension GERD (gastroesophageal reflux disease)  Arthritis Chronic cough  Obesity (BMI 30-39.9)    Reproductive/Obstetrics negative OB ROS                              Anesthesia Physical Anesthesia Plan  ASA: 2  Anesthesia Plan: MAC   Post-op Pain Management:    Induction: Intravenous  PONV Risk Score and Plan:   Airway Management Planned: Natural Airway and Nasal Cannula  Additional Equipment:   Intra-op Plan:   Post-operative Plan:   Informed Consent: I have reviewed the patients History and Physical, chart, labs and discussed the procedure including the risks, benefits and alternatives for the proposed anesthesia with the patient or authorized representative who has indicated his/her understanding and acceptance.     Dental Advisory Given  Plan Discussed with: Anesthesiologist, CRNA and Surgeon  Anesthesia Plan Comments: (Patient consented for risks  of anesthesia including but not limited to:  - adverse reactions to medications - damage to eyes, teeth, lips or other oral mucosa - nerve damage due to positioning  - sore throat or hoarseness - Damage to heart, brain, nerves, lungs, other parts of body or loss of life  Patient voiced understanding.)         Anesthesia Quick Evaluation

## 2023-07-13 NOTE — Discharge Instructions (Signed)

## 2023-07-17 ENCOUNTER — Encounter: Payer: Self-pay | Admitting: Ophthalmology

## 2023-07-17 ENCOUNTER — Ambulatory Visit: Payer: PPO | Admitting: Anesthesiology

## 2023-07-17 ENCOUNTER — Encounter
Admission: RE | Disposition: A | Discharge status: Home / Self Care | Payer: Self-pay | Source: Ambulatory Visit | Attending: Ophthalmology

## 2023-07-17 ENCOUNTER — Ambulatory Visit
Admission: RE | Admit: 2023-07-17 | Discharge: 2023-07-17 | Disposition: A | Discharge status: Home / Self Care | Payer: PPO | Source: Ambulatory Visit | Attending: Ophthalmology | Admitting: Ophthalmology

## 2023-07-17 DIAGNOSIS — Z6837 Body mass index (BMI) 37.0-37.9, adult: Secondary | ICD-10-CM | POA: Insufficient documentation

## 2023-07-17 DIAGNOSIS — K219 Gastro-esophageal reflux disease without esophagitis: Secondary | ICD-10-CM | POA: Insufficient documentation

## 2023-07-17 DIAGNOSIS — I10 Essential (primary) hypertension: Secondary | ICD-10-CM | POA: Insufficient documentation

## 2023-07-17 DIAGNOSIS — H2511 Age-related nuclear cataract, right eye: Secondary | ICD-10-CM | POA: Diagnosis not present

## 2023-07-17 DIAGNOSIS — E669 Obesity, unspecified: Secondary | ICD-10-CM | POA: Diagnosis not present

## 2023-07-17 DIAGNOSIS — H269 Unspecified cataract: Secondary | ICD-10-CM | POA: Diagnosis not present

## 2023-07-17 HISTORY — DX: Obesity, unspecified: E66.9

## 2023-07-17 HISTORY — PX: CATARACT EXTRACTION W/PHACO: SHX586

## 2023-07-17 HISTORY — DX: Unspecified osteoarthritis, unspecified site: M19.90

## 2023-07-17 HISTORY — DX: Chronic cough: R05.3

## 2023-07-17 HISTORY — DX: Essential (primary) hypertension: I10

## 2023-07-17 HISTORY — DX: Gastro-esophageal reflux disease without esophagitis: K21.9

## 2023-07-17 HISTORY — DX: Other specified postprocedural states: Z98.890

## 2023-07-17 SURGERY — PHACOEMULSIFICATION, CATARACT, WITH IOL INSERTION
Anesthesia: Monitor Anesthesia Care | Site: Eye | Laterality: Right

## 2023-07-17 MED ORDER — FENTANYL CITRATE (PF) 100 MCG/2ML IJ SOLN
INTRAMUSCULAR | Status: AC
  Filled 2023-07-17: qty 2

## 2023-07-17 MED ORDER — TETRACAINE HCL 0.5 % OP SOLN
OPHTHALMIC | Status: AC
  Filled 2023-07-17: qty 4

## 2023-07-17 MED ORDER — FENTANYL CITRATE (PF) 100 MCG/2ML IJ SOLN
INTRAMUSCULAR | Status: DC | PRN
  Administered 2023-07-17: 50 ug via INTRAVENOUS

## 2023-07-17 MED ORDER — SIGHTPATH DOSE#1 BSS IO SOLN
INTRAOCULAR | Status: DC | PRN
  Administered 2023-07-17: 15 mL via INTRAOCULAR

## 2023-07-17 MED ORDER — MIDAZOLAM HCL 2 MG/2ML IJ SOLN
INTRAMUSCULAR | Status: AC
  Filled 2023-07-17: qty 2

## 2023-07-17 MED ORDER — SIGHTPATH DOSE#1 NA CHONDROIT SULF-NA HYALURON 40-17 MG/ML IO SOLN
INTRAOCULAR | Status: DC | PRN
  Administered 2023-07-17: 1 mL via INTRAOCULAR

## 2023-07-17 MED ORDER — TETRACAINE HCL 0.5 % OP SOLN
1.0000 [drp] | OPHTHALMIC | Status: DC | PRN
  Administered 2023-07-17 (×3): 1 [drp] via OPHTHALMIC

## 2023-07-17 MED ORDER — MOXIFLOXACIN HCL 0.5 % OP SOLN
OPHTHALMIC | Status: DC | PRN
  Administered 2023-07-17: .2 mL via OPHTHALMIC

## 2023-07-17 MED ORDER — ARMC OPHTHALMIC DILATING DROPS
1.0000 | OPHTHALMIC | Status: DC | PRN
  Administered 2023-07-17 (×3): 1 via OPHTHALMIC

## 2023-07-17 MED ORDER — BRIMONIDINE TARTRATE-TIMOLOL 0.2-0.5 % OP SOLN
OPHTHALMIC | Status: DC | PRN
  Administered 2023-07-17: 1 [drp] via OPHTHALMIC

## 2023-07-17 MED ORDER — GLYCOPYRROLATE 0.2 MG/ML IJ SOLN
INTRAMUSCULAR | Status: AC
  Filled 2023-07-17: qty 1

## 2023-07-17 MED ORDER — SIGHTPATH DOSE#1 BSS IO SOLN
INTRAOCULAR | Status: DC | PRN
  Administered 2023-07-17: 48 mL via OPHTHALMIC

## 2023-07-17 MED ORDER — ONDANSETRON HCL 4 MG/2ML IJ SOLN
INTRAMUSCULAR | Status: AC
  Filled 2023-07-17: qty 2

## 2023-07-17 MED ORDER — SIGHTPATH DOSE#1 BSS IO SOLN
INTRAOCULAR | Status: DC | PRN
  Administered 2023-07-17: 2 mL

## 2023-07-17 MED ORDER — ONDANSETRON HCL 4 MG/2ML IJ SOLN
4.0000 mg | Freq: Once | INTRAMUSCULAR | Status: AC
  Administered 2023-07-17: 4 mg via INTRAVENOUS

## 2023-07-17 MED ORDER — LACTATED RINGERS IV SOLN: INTRAVENOUS | Status: DC

## 2023-07-17 MED ORDER — MIDAZOLAM HCL 2 MG/2ML IJ SOLN
INTRAMUSCULAR | Status: DC | PRN
  Administered 2023-07-17: 1 mg via INTRAVENOUS

## 2023-07-17 MED ORDER — GLYCOPYRROLATE 0.2 MG/ML IJ SOLN
INTRAMUSCULAR | Status: DC | PRN
Start: 2023-07-17 — End: 2023-07-17
  Administered 2023-07-17: .2 mg via INTRAVENOUS

## 2023-07-17 SURGICAL SUPPLY — 10 items
ANGLE REVERSE CUT SHRT 25GA (CUTTER) ×1
CATARACT SUITE SIGHTPATH (MISCELLANEOUS) ×1
CYSTOTOME ANGL RVRS SHRT 25G (CUTTER) ×1 IMPLANT
FEE CATARACT SUITE SIGHTPATH (MISCELLANEOUS) ×1 IMPLANT
GLOVE BIOGEL PI IND STRL 8 (GLOVE) ×1 IMPLANT
GLOVE SURG LX STRL 8.0 MICRO (GLOVE) ×1 IMPLANT
LENS IOL TECNIS EYHANCE 20.0 (Intraocular Lens) IMPLANT
NDL FILTER BLUNT 18X1 1/2 (NEEDLE) ×1 IMPLANT
NEEDLE FILTER BLUNT 18X1 1/2 (NEEDLE) ×1
SYR 3ML LL SCALE MARK (SYRINGE) ×1 IMPLANT

## 2023-07-17 NOTE — Anesthesia Postprocedure Evaluation (Signed)
Anesthesia Post Note  Patient: Veronica Olsen  Procedure(s) Performed: CATARACT EXTRACTION PHACO AND INTRAOCULAR LENS PLACEMENT (IOC) RIGHT 8.14 00:41.8 (Right: Eye)  Patient location during evaluation: PACU Anesthesia Type: MAC Level of consciousness: awake and alert Pain management: pain level controlled Vital Signs Assessment: post-procedure vital signs reviewed and stable Respiratory status: spontaneous breathing, nonlabored ventilation, respiratory function stable and patient connected to nasal cannula oxygen Cardiovascular status: stable and blood pressure returned to baseline Postop Assessment: no apparent nausea or vomiting Anesthetic complications: no   No notable events documented.   Last Vitals:  Vitals:   07/17/23 1135 07/17/23 1138  BP:  105/68  Pulse: (!) 59 61  Resp: 19 17  Temp:    SpO2: 93% 92%    Last Pain:  Vitals:   07/17/23 1002  TempSrc: Temporal  PainSc: 0-No pain                 Westyn Driggers C Wava Kildow

## 2023-07-17 NOTE — Op Note (Signed)
PREOPERATIVE DIAGNOSIS:  Nuclear sclerotic cataract of the right eye.   POSTOPERATIVE DIAGNOSIS:  Cataract   OPERATIVE PROCEDURE:ORPROCALL@   SURGEON:  Veronica Manila, MD.   ANESTHESIA:  Anesthesiologist: Marisue Humble, MD CRNA: Barbette Hair, CRNA  1.      Managed anesthesia care. 2.      0.2ml of Shugarcaine was instilled in the eye following the paracentesis.   COMPLICATIONS:  None.   TECHNIQUE:   Stop and chop   DESCRIPTION OF PROCEDURE:  The patient was examined and consented in the preoperative holding area where the aforementioned topical anesthesia was applied to the right eye and then brought back to the Operating Room where the right eye was prepped and draped in the usual sterile ophthalmic fashion and a lid speculum was placed. A paracentesis was created with the side port blade and the anterior chamber was filled with viscoelastic. A near clear corneal incision was performed with the steel keratome. A continuous curvilinear capsulorrhexis was performed with a cystotome followed by the capsulorrhexis forceps. Hydrodissection and hydrodelineation were carried out with BSS on a blunt cannula. The lens was removed in a stop and chop  technique and the remaining cortical material was removed with the irrigation-aspiration handpiece. The capsular bag was inflated with viscoelastic and the Technis ZCB00  lens was placed in the capsular bag without complication. The remaining viscoelastic was removed from the eye with the irrigation-aspiration handpiece. The wounds were hydrated. The anterior chamber was flushed with BSS and the eye was inflated to physiologic pressure. 0.42ml of Vigamox was placed in the anterior chamber. The wounds were found to be water tight. The eye was dressed with Combigan. The patient was given protective glasses to wear throughout the day and a shield with which to sleep tonight. The patient was also given drops with which to begin a drop regimen today and will  follow-up with me in one day. Implant Name Type Inv. Item Serial No. Manufacturer Lot No. LRB No. Used Action  LENS IOL TECNIS EYHANCE 20.0 - [id number] Intraocular Lens LENS IOL TECNIS EYHANCE 20.0 [id number] [employer]  Right 1 Implanted   Procedure(s): CATARACT EXTRACTION PHACO AND INTRAOCULAR LENS PLACEMENT (IOC) RIGHT 8.14 00:41.8 (Right)  Electronically signed: Galen Olsen 07/17/2023 11:32 AM

## 2023-07-17 NOTE — Transfer of Care (Signed)
Immediate Anesthesia Transfer of Care Note  Patient: Veronica Olsen  Procedure(s) Performed: CATARACT EXTRACTION PHACO AND INTRAOCULAR LENS PLACEMENT (IOC) RIGHT 8.14 00:41.8 (Right: Eye)  Patient Location: PACU  Anesthesia Type: MAC  Level of Consciousness: awake, alert  and patient cooperative  Airway and Oxygen Therapy: Patient Spontanous Breathing and Patient connected to supplemental oxygen  Post-op Assessment: Post-op Vital signs reviewed, Patient's Cardiovascular Status Stable, Respiratory Function Stable, Patent Airway and No signs of Nausea or vomiting  Post-op Vital Signs: Reviewed and stable  Complications: No notable events documented.

## 2023-07-17 NOTE — H&P (Signed)
[facility]   Primary Care Physician:  Associates, [facility] Medical Ophthalmologist: Dr. Druscilla Brownie  Pre-Procedure History & Physical: HPI:  Veronica Olsen is a 82 y.o. female here for cataract surgery.   Past Medical History:  Diagnosis Date   Arthritis    knees   Chronic cough    GERD (gastroesophageal reflux disease)    Hypertension    Obesity (BMI 30-39.9)    PONV (postoperative nausea and vomiting)    after shoulder surgery    Past Surgical History:  Procedure Laterality Date   APPENDECTOMY     AGE 89   SHOULDER ARTHROSCOPY WITH OPEN ROTATOR CUFF REPAIR AND DISTAL CLAVICLE ACROMINECTOMY Right 03/09/2016   Procedure: SHOULDER ARTHROSCOPY WITH OPEN ROTATOR CUFF REPAIR AND DISTAL CLAVICLE ACROMINECTOMY;  Surgeon: Juanell Fairly, MD;  Location: [facility] OR[facility];  Service: Orthopedics;  Laterality: Right;    Prior to Admission medications   Medication Sig Start Date End Date Taking? Authorizing Provider  furosemide (LASIX) 40 MG tablet Take 20 mg by mouth daily.   Yes [provider]  ibuprofen (ADVIL,MOTRIN) 200 MG tablet Take 200 mg by mouth every 6 (six) hours as needed.   Yes [provider]  vitamin B-12 (CYANOCOBALAMIN) 500 MCG tablet Take 500 mcg by mouth daily.   Yes [provider]    Allergies as of 06/15/2023 - Review Complete 08/24/2016  Allergen Reaction Noted   Shellfish allergy Nausea And Vomiting 11/25/2015    Family History  Problem Relation Age of Onset   Heart attack Mother    Heart failure Father     Social History   Socioeconomic History   Marital status: Widowed    Spouse name: Not on file   Number of children: Not on file   Years of education: Not on file   Highest education level: Not on file  Occupational History   Not on file  Tobacco Use   Smoking status: Never   Smokeless tobacco: Never  Vaping Use   Vaping status: Never Used  Substance and Sexual Activity   Alcohol use: No   Drug use: No    Sexual activity: Not on file  Other Topics Concern   Not on file  Social History Narrative   Not on file   Social Determinants of Health   Financial Resource Strain: Not on file  Food Insecurity: Not on file  Transportation Needs: Not on file  Physical Activity: Not on file  Stress: Not on file  Social Connections: Not on file  Intimate Partner Violence: Not on file    Review of Systems: See HPI, otherwise negative ROS  Physical Exam: BP 138/79   Pulse 79   Temp (!) 97 F (36.1 C) (Temporal)   Resp 10   Ht 5\' 4"  (1.626 m)   Wt 98.9 kg   SpO2 96%   BMI 37.42 kg/m  General:   Alert, cooperative in NAD Head:  Normocephalic and atraumatic. Respiratory:  Normal work of breathing. Cardiovascular:  RRR  Impression/Plan: Veronica Olsen is here for cataract surgery.  Risks, benefits, limitations, and alternatives regarding cataract surgery have been reviewed with the patient.  Questions have been answered.  All parties agreeable.   Galen Manila, MD  07/17/2023, 11:10 AM

## 2023-07-18 ENCOUNTER — Encounter: Payer: Self-pay | Admitting: Ophthalmology

## 2023-07-18 DIAGNOSIS — H2512 Age-related nuclear cataract, left eye: Secondary | ICD-10-CM | POA: Diagnosis not present

## 2023-07-18 NOTE — Anesthesia Preprocedure Evaluation (Addendum)
Anesthesia Evaluation  Patient identified by MRN, date of birth, ID band Patient awake    Reviewed: Allergy & Precautions, H&P , NPO status , Patient's Chart, lab work & pertinent test results  History of Anesthesia Complications (+) PONV and history of anesthetic complications  Airway Mallampati: IV  TM Distance: <3 FB Neck ROM: Full    Dental no notable dental hx. (+) Chipped   Pulmonary neg pulmonary ROS   Pulmonary exam normal breath sounds clear to auscultation       Cardiovascular hypertension, Normal cardiovascular exam Rhythm:Regular Rate:Normal     Neuro/Psych negative neurological ROS  negative psych ROS   GI/Hepatic Neg liver ROS,GERD  ,,  Endo/Other  negative endocrine ROS    Renal/GU negative Renal ROS  negative genitourinary   Musculoskeletal  (+) Arthritis ,    Abdominal   Peds negative pediatric ROS (+)  Hematology negative hematology ROS (+)   Anesthesia Other Findings PONV (postoperative nausea and vomiting) Hypertension GERD (gastroesophageal reflux disease) Arthritis Chronic cough Obesity (BMI 30-39.9)  Previous cataract surgery 07-17-23 Dr. Juel Burrow  Patient was nauseated last time, so will administer zofran now preop.    Reproductive/Obstetrics negative OB ROS                             Anesthesia Physical Anesthesia Plan  ASA: 2  Anesthesia Plan: MAC   Post-op Pain Management:    Induction: Intravenous  PONV Risk Score and Plan:   Airway Management Planned: Natural Airway and Nasal Cannula  Additional Equipment:   Intra-op Plan:   Post-operative Plan:   Informed Consent: I have reviewed the patients History and Physical, chart, labs and discussed the procedure including the risks, benefits and alternatives for the proposed anesthesia with the patient or authorized representative who has indicated his/her understanding and acceptance.      Dental Advisory Given  Plan Discussed with: Anesthesiologist, CRNA and Surgeon  Anesthesia Plan Comments: (Patient consented for risks of anesthesia including but not limited to:  - adverse reactions to medications - damage to eyes, teeth, lips or other oral mucosa - nerve damage due to positioning  - sore throat or hoarseness - Damage to heart, brain, nerves, lungs, other parts of body or loss of life  Patient voiced understanding.)        Anesthesia Quick Evaluation

## 2023-07-30 NOTE — Discharge Instructions (Signed)

## 2023-07-31 ENCOUNTER — Ambulatory Visit: Payer: PPO | Admitting: Anesthesiology

## 2023-07-31 ENCOUNTER — Encounter
Admission: RE | Disposition: A | Discharge status: Home / Self Care | Payer: Self-pay | Source: Ambulatory Visit | Attending: Ophthalmology

## 2023-07-31 ENCOUNTER — Other Ambulatory Visit: Payer: Self-pay

## 2023-07-31 ENCOUNTER — Ambulatory Visit
Admission: RE | Admit: 2023-07-31 | Discharge: 2023-07-31 | Disposition: A | Discharge status: Home / Self Care | Payer: PPO | Source: Ambulatory Visit | Attending: Ophthalmology | Admitting: Ophthalmology

## 2023-07-31 ENCOUNTER — Encounter: Payer: Self-pay | Admitting: Ophthalmology

## 2023-07-31 DIAGNOSIS — Z9841 Cataract extraction status, right eye: Secondary | ICD-10-CM | POA: Insufficient documentation

## 2023-07-31 DIAGNOSIS — I10 Essential (primary) hypertension: Secondary | ICD-10-CM | POA: Insufficient documentation

## 2023-07-31 DIAGNOSIS — Z6836 Body mass index (BMI) 36.0-36.9, adult: Secondary | ICD-10-CM | POA: Insufficient documentation

## 2023-07-31 DIAGNOSIS — K219 Gastro-esophageal reflux disease without esophagitis: Secondary | ICD-10-CM | POA: Diagnosis not present

## 2023-07-31 DIAGNOSIS — H2512 Age-related nuclear cataract, left eye: Secondary | ICD-10-CM | POA: Diagnosis not present

## 2023-07-31 DIAGNOSIS — M1990 Unspecified osteoarthritis, unspecified site: Secondary | ICD-10-CM | POA: Diagnosis not present

## 2023-07-31 DIAGNOSIS — E669 Obesity, unspecified: Secondary | ICD-10-CM | POA: Diagnosis not present

## 2023-07-31 DIAGNOSIS — R053 Chronic cough: Secondary | ICD-10-CM | POA: Diagnosis not present

## 2023-07-31 DIAGNOSIS — Z961 Presence of intraocular lens: Secondary | ICD-10-CM | POA: Insufficient documentation

## 2023-07-31 HISTORY — PX: CATARACT EXTRACTION W/PHACO: SHX586

## 2023-07-31 SURGERY — PHACOEMULSIFICATION, CATARACT, WITH IOL INSERTION
Anesthesia: Monitor Anesthesia Care | Laterality: Left

## 2023-07-31 MED ORDER — SIGHTPATH DOSE#1 BSS IO SOLN
INTRAOCULAR | Status: DC | PRN
  Administered 2023-07-31: 15 mL via INTRAOCULAR

## 2023-07-31 MED ORDER — TETRACAINE HCL 0.5 % OP SOLN
OPHTHALMIC | Status: AC
  Filled 2023-07-31: qty 4

## 2023-07-31 MED ORDER — ONDANSETRON HCL 4 MG/2ML IJ SOLN
INTRAMUSCULAR | Status: AC
  Filled 2023-07-31: qty 2

## 2023-07-31 MED ORDER — ONDANSETRON HCL 4 MG/2ML IJ SOLN
4.0000 mg | Freq: Once | INTRAMUSCULAR | Status: AC
  Administered 2023-07-31: 4 mg via INTRAVENOUS

## 2023-07-31 MED ORDER — SIGHTPATH DOSE#1 NA CHONDROIT SULF-NA HYALURON 40-17 MG/ML IO SOLN
INTRAOCULAR | Status: DC | PRN
  Administered 2023-07-31: 1 mL via INTRAOCULAR

## 2023-07-31 MED ORDER — SIGHTPATH DOSE#1 BSS IO SOLN
INTRAOCULAR | Status: DC | PRN
  Administered 2023-07-31: 2 mL

## 2023-07-31 MED ORDER — MIDAZOLAM HCL 2 MG/2ML IJ SOLN
INTRAMUSCULAR | Status: AC
  Filled 2023-07-31: qty 2

## 2023-07-31 MED ORDER — BRIMONIDINE TARTRATE-TIMOLOL 0.2-0.5 % OP SOLN
OPHTHALMIC | Status: DC | PRN
  Administered 2023-07-31: 1 [drp] via OPHTHALMIC

## 2023-07-31 MED ORDER — ARMC OPHTHALMIC DILATING DROPS
OPHTHALMIC | Status: AC
  Filled 2023-07-31: qty 0.5

## 2023-07-31 MED ORDER — LACTATED RINGERS IV SOLN: INTRAVENOUS | Status: DC

## 2023-07-31 MED ORDER — ARMC OPHTHALMIC DILATING DROPS
1.0000 | OPHTHALMIC | Status: DC | PRN
  Administered 2023-07-31 (×3): 1 via OPHTHALMIC

## 2023-07-31 MED ORDER — MOXIFLOXACIN HCL 0.5 % OP SOLN
OPHTHALMIC | Status: DC | PRN
  Administered 2023-07-31: .2 mL via OPHTHALMIC

## 2023-07-31 MED ORDER — TETRACAINE HCL 0.5 % OP SOLN
1.0000 [drp] | OPHTHALMIC | Status: DC | PRN
  Administered 2023-07-31 (×3): 1 [drp] via OPHTHALMIC

## 2023-07-31 MED ORDER — SODIUM CHLORIDE 0.9% FLUSH: 10.0000 mL | INTRAVENOUS | Status: DC | PRN

## 2023-07-31 MED ORDER — MIDAZOLAM HCL 5 MG/5ML IJ SOLN
INTRAMUSCULAR | Status: DC | PRN
Start: 2023-07-31 — End: 2023-07-31
  Administered 2023-07-31: 2 mg via INTRAVENOUS

## 2023-07-31 MED ORDER — SIGHTPATH DOSE#1 BSS IO SOLN
INTRAOCULAR | Status: DC | PRN
  Administered 2023-07-31: 51 mL via OPHTHALMIC

## 2023-07-31 SURGICAL SUPPLY — 16 items
ANGLE REVERSE CUT SHRT 25GA (CUTTER) ×1
CANNULA ANT/CHMB 27G (MISCELLANEOUS) IMPLANT
CANNULA ANT/CHMB 27GA (MISCELLANEOUS)
CATARACT SUITE SIGHTPATH (MISCELLANEOUS) ×1
CYSTOTOME ANGL RVRS SHRT 25G (CUTTER) ×1 IMPLANT
FEE CATARACT SUITE SIGHTPATH (MISCELLANEOUS) ×1 IMPLANT
GLOVE BIOGEL PI IND STRL 8 (GLOVE) ×1 IMPLANT
GLOVE SURG LX STRL 8.0 MICRO (GLOVE) ×1 IMPLANT
LENS IOL TECNIS EYHANCE 20.0 (Intraocular Lens) IMPLANT
NDL FILTER BLUNT 18X1 1/2 (NEEDLE) ×1 IMPLANT
NEEDLE FILTER BLUNT 18X1 1/2 (NEEDLE) ×1
PACK VIT ANT 23G (MISCELLANEOUS) IMPLANT
RING MALYGIN (MISCELLANEOUS) IMPLANT
SUT ETHILON 10-0 CS-B-6CS-B-6 (SUTURE)
SUTURE EHLN 10-0 CS-B-6CS-B-6 (SUTURE) IMPLANT
SYR 3ML LL SCALE MARK (SYRINGE) ×1 IMPLANT

## 2023-07-31 NOTE — Op Note (Signed)
PREOPERATIVE DIAGNOSIS:  Nuclear sclerotic cataract of the left eye.   POSTOPERATIVE DIAGNOSIS:  Nuclear sclerotic cataract of the left eye.   OPERATIVE PROCEDURE:ORPROCALL@   SURGEON:  Galen Manila, MD.   ANESTHESIA:  Anesthesiologist: Marisue Humble, MD CRNA: Domenic Moras, CRNA  1.      Managed anesthesia care. 2.     0.11ml of Shugarcaine was instilled following the paracentesis   COMPLICATIONS:  None.   TECHNIQUE:   Stop and chop   DESCRIPTION OF PROCEDURE:  The patient was examined and consented in the preoperative holding area where the aforementioned topical anesthesia was applied to the left eye and then brought back to the Operating Room where the left eye was prepped and draped in the usual sterile ophthalmic fashion and a lid speculum was placed. A paracentesis was created with the side port blade and the anterior chamber was filled with viscoelastic. A near clear corneal incision was performed with the steel keratome. A continuous curvilinear capsulorrhexis was performed with a cystotome followed by the capsulorrhexis forceps. Hydrodissection and hydrodelineation were carried out with BSS on a blunt cannula. The lens was removed in a stop and chop  technique and the remaining cortical material was removed with the irrigation-aspiration handpiece. The capsular bag was inflated with viscoelastic and the Technis ZCB00 lens was placed in the capsular bag without complication. The remaining viscoelastic was removed from the eye with the irrigation-aspiration handpiece. The wounds were hydrated. The anterior chamber was flushed with BSS and the eye was inflated to physiologic pressure. 0.46ml Vigamox was placed in the anterior chamber. The wounds were found to be water tight. The eye was dressed with Combigan. The patient was given protective glasses to wear throughout the day and a shield with which to sleep tonight. The patient was also given drops with which to begin a drop  regimen today and will follow-up with me in one day. Implant Name Type Inv. Item Serial No. Manufacturer Lot No. LRB No. Used Action  LENS IOL TECNIS EYHANCE 20.0 - [id number] Intraocular Lens LENS IOL TECNIS EYHANCE 20.0 [id number] [employer]  Left 1 Implanted    Procedure(s): CATARACT EXTRACTION PHACO AND INTRAOCULAR LENS PLACEMENT (IOC) LEFT 10.21 00:56.4 (Left)  Electronically signed: Galen Manila 07/31/2023 10:57 AM

## 2023-07-31 NOTE — Transfer of Care (Signed)
Immediate Anesthesia Transfer of Care Note  Patient: Veronica Olsen  Procedure(s) Performed: CATARACT EXTRACTION PHACO AND INTRAOCULAR LENS PLACEMENT (IOC) LEFT 10.21 00:56.4 (Left)  Patient Location: PACU  Anesthesia Type: MAC  Level of Consciousness: awake, alert  and patient cooperative  Airway and Oxygen Therapy: Patient Spontanous Breathing and Patient connected to supplemental oxygen  Post-op Assessment: Post-op Vital signs reviewed, Patient's Cardiovascular Status Stable, Respiratory Function Stable, Patent Airway and No signs of Nausea or vomiting  Post-op Vital Signs: Reviewed and stable  Complications: No notable events documented.

## 2023-07-31 NOTE — H&P (Signed)
[facility]   Primary Care Physician:  [facility], [facility] Medical Ophthalmologist: Dr. Druscilla Brownie  Pre-Procedure History & Physical: HPI:  ESTEFANA TAYLOR is a 82 y.o. female here for cataract surgery.   Past Medical History:  Diagnosis Date   Arthritis    knees   Chronic cough    GERD (gastroesophageal reflux disease)    Hypertension    Obesity (BMI 30-39.9)    PONV (postoperative nausea and vomiting)    after shoulder surgery   PONV (postoperative nausea and vomiting)     Past Surgical History:  Procedure Laterality Date   APPENDECTOMY     AGE 19   CATARACT EXTRACTION W/PHACO Right 07/17/2023   Procedure: CATARACT EXTRACTION PHACO AND INTRAOCULAR LENS PLACEMENT (IOC) RIGHT 8.14 00:41.8;  Surgeon: Galen Manila, MD;  Location: [facility];  Service: Ophthalmology;  Laterality: Right;   SHOULDER ARTHROSCOPY WITH OPEN ROTATOR CUFF REPAIR AND DISTAL CLAVICLE ACROMINECTOMY Right 03/09/2016   Procedure: SHOULDER ARTHROSCOPY WITH OPEN ROTATOR CUFF REPAIR AND DISTAL CLAVICLE ACROMINECTOMY;  Surgeon: Juanell Fairly, MD;  Location: [facility];  Service: Orthopedics;  Laterality: Right;    Prior to Admission medications   Medication Sig Start Date End Date Taking? Authorizing Provider  furosemide (LASIX) 40 MG tablet Take 20 mg by mouth daily.    [provider]  ibuprofen (ADVIL,MOTRIN) 200 MG tablet Take 200 mg by mouth every 6 (six) hours as needed.    [provider]  vitamin B-12 (CYANOCOBALAMIN) 500 MCG tablet Take 500 mcg by mouth daily.    [provider]    Allergies as of 06/15/2023 - Review Complete 08/24/2016  Allergen Reaction Noted   Shellfish allergy Nausea And Vomiting 11/25/2015    Family History  Problem Relation Age of Onset   Heart attack Mother    Heart failure Father     Social History   Socioeconomic History   Marital status: Widowed    Spouse name: Not on file   Number of children: Not on file    Years of education: Not on file   Highest education level: Not on file  Occupational History   Not on file  Tobacco Use   Smoking status: Never   Smokeless tobacco: Never  Vaping Use   Vaping status: Never Used  Substance and Sexual Activity   Alcohol use: No   Drug use: No   Sexual activity: Not on file  Other Topics Concern   Not on file  Social History Narrative   Not on file   Social Determinants of Health   Financial Resource Strain: Not on file  Food Insecurity: Not on file  Transportation Needs: Not on file  Physical Activity: Not on file  Stress: Not on file  Social Connections: Not on file  Intimate Partner Violence: Not on file    Review of Systems: See HPI, otherwise negative ROS  Physical Exam: BP 126/81   Temp 98.8 F (37.1 C) (Temporal)   Resp 16   Ht 5' 4.02" (1.626 m)   Wt 97.6 kg   SpO2 95%   BMI 36.90 kg/m  General:   Alert, cooperative in NAD Head:  Normocephalic and atraumatic. Respiratory:  Normal work of breathing. Cardiovascular:  RRR  Impression/Plan: NEELA ZECCA is here for cataract surgery.  Risks, benefits, limitations, and alternatives regarding cataract surgery have been reviewed with the patient.  Questions have been answered.  All parties agreeable.   Galen Manila, MD  07/31/2023, 10:28 AM

## 2023-07-31 NOTE — Anesthesia Postprocedure Evaluation (Signed)
Anesthesia Post Note  Patient: Veronica Olsen  Procedure(s) Performed: CATARACT EXTRACTION PHACO AND INTRAOCULAR LENS PLACEMENT (IOC) LEFT 10.21 00:56.4 (Left)  Patient location during evaluation: PACU Anesthesia Type: MAC Level of consciousness: awake and alert Pain management: pain level controlled Vital Signs Assessment: post-procedure vital signs reviewed and stable Respiratory status: spontaneous breathing, nonlabored ventilation, respiratory function stable and patient connected to nasal cannula oxygen Cardiovascular status: stable and blood pressure returned to baseline Postop Assessment: no apparent nausea or vomiting Anesthetic complications: no   No notable events documented.   Last Vitals:  Vitals:   07/31/23 1058 07/31/23 1105  BP: 112/67 103/66  Pulse: 65 61  Resp: 11 (!) 23  Temp: (!) 36.1 C (!) 36.1 C  SpO2: 96% 96%    Last Pain:  Vitals:   07/31/23 1105  TempSrc:   PainSc: 0-No pain                 Petrona Wyeth C Kensleigh Gates

## 2023-08-02 ENCOUNTER — Encounter: Payer: Self-pay | Admitting: Ophthalmology

## 2023-09-05 DIAGNOSIS — H353221 Exudative age-related macular degeneration, left eye, with active choroidal neovascularization: Secondary | ICD-10-CM | POA: Diagnosis not present

## 2023-09-05 DIAGNOSIS — H353211 Exudative age-related macular degeneration, right eye, with active choroidal neovascularization: Secondary | ICD-10-CM | POA: Diagnosis not present

## 2023-11-28 DIAGNOSIS — H353211 Exudative age-related macular degeneration, right eye, with active choroidal neovascularization: Secondary | ICD-10-CM | POA: Diagnosis not present

## 2023-11-28 DIAGNOSIS — H353221 Exudative age-related macular degeneration, left eye, with active choroidal neovascularization: Secondary | ICD-10-CM | POA: Diagnosis not present

## 2024-01-03 DIAGNOSIS — L820 Inflamed seborrheic keratosis: Secondary | ICD-10-CM | POA: Diagnosis not present

## 2024-01-03 DIAGNOSIS — L578 Other skin changes due to chronic exposure to nonionizing radiation: Secondary | ICD-10-CM | POA: Diagnosis not present

## 2024-01-03 DIAGNOSIS — D485 Neoplasm of uncertain behavior of skin: Secondary | ICD-10-CM | POA: Diagnosis not present

## 2024-01-03 DIAGNOSIS — L821 Other seborrheic keratosis: Secondary | ICD-10-CM | POA: Diagnosis not present

## 2024-01-03 DIAGNOSIS — L814 Other melanin hyperpigmentation: Secondary | ICD-10-CM | POA: Diagnosis not present

## 2024-02-28 DIAGNOSIS — H353221 Exudative age-related macular degeneration, left eye, with active choroidal neovascularization: Secondary | ICD-10-CM | POA: Diagnosis not present

## 2024-02-28 DIAGNOSIS — H353231 Exudative age-related macular degeneration, bilateral, with active choroidal neovascularization: Secondary | ICD-10-CM | POA: Diagnosis not present

## 2024-03-12 DIAGNOSIS — E669 Obesity, unspecified: Secondary | ICD-10-CM | POA: Diagnosis not present

## 2024-03-12 DIAGNOSIS — E538 Deficiency of other specified B group vitamins: Secondary | ICD-10-CM | POA: Diagnosis not present

## 2024-03-12 DIAGNOSIS — R600 Localized edema: Secondary | ICD-10-CM | POA: Diagnosis not present

## 2024-03-12 DIAGNOSIS — Z79899 Other long term (current) drug therapy: Secondary | ICD-10-CM | POA: Diagnosis not present

## 2024-03-12 DIAGNOSIS — K219 Gastro-esophageal reflux disease without esophagitis: Secondary | ICD-10-CM | POA: Diagnosis not present

## 2024-05-29 DIAGNOSIS — H353221 Exudative age-related macular degeneration, left eye, with active choroidal neovascularization: Secondary | ICD-10-CM | POA: Diagnosis not present

## 2024-05-29 DIAGNOSIS — H353211 Exudative age-related macular degeneration, right eye, with active choroidal neovascularization: Secondary | ICD-10-CM | POA: Diagnosis not present

## 2024-08-07 DIAGNOSIS — Z961 Presence of intraocular lens: Secondary | ICD-10-CM | POA: Diagnosis not present

## 2024-08-07 DIAGNOSIS — D3131 Benign neoplasm of right choroid: Secondary | ICD-10-CM | POA: Diagnosis not present

## 2024-08-07 DIAGNOSIS — H353211 Exudative age-related macular degeneration, right eye, with active choroidal neovascularization: Secondary | ICD-10-CM | POA: Diagnosis not present

## 2024-08-07 DIAGNOSIS — H353221 Exudative age-related macular degeneration, left eye, with active choroidal neovascularization: Secondary | ICD-10-CM | POA: Diagnosis not present

## 2024-08-19 DIAGNOSIS — L600 Ingrowing nail: Secondary | ICD-10-CM | POA: Diagnosis not present

## 2024-08-19 DIAGNOSIS — B351 Tinea unguium: Secondary | ICD-10-CM | POA: Diagnosis not present

## 2024-08-19 DIAGNOSIS — L03032 Cellulitis of left toe: Secondary | ICD-10-CM | POA: Diagnosis not present

## 2024-08-21 DIAGNOSIS — H353211 Exudative age-related macular degeneration, right eye, with active choroidal neovascularization: Secondary | ICD-10-CM | POA: Diagnosis not present
